# Patient Record
Sex: Female | Born: 2000 | Race: Black or African American | Hispanic: No | Marital: Single | Smoking: Never smoker
Health system: Southern US, Community
[De-identification: ages and names within clinical notes are randomized; demographics above are authoritative.]

---
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
## 2021-04-17 ENCOUNTER — Other Ambulatory Visit: Payer: Self-pay

## 2021-04-17 ENCOUNTER — Ambulatory Visit (HOSPITAL_COMMUNITY)
Admission: EM | Admit: 2021-04-17 | Discharge: 2021-04-17 | Disposition: A | Discharge status: Home / Self Care | Payer: BC Managed Care – PPO | Attending: Family Medicine | Admitting: Family Medicine

## 2021-04-17 ENCOUNTER — Encounter (HOSPITAL_COMMUNITY): Payer: Self-pay

## 2021-04-17 DIAGNOSIS — J069 Acute upper respiratory infection, unspecified: Secondary | ICD-10-CM | POA: Diagnosis not present

## 2021-04-17 DIAGNOSIS — Z20822 Contact with and (suspected) exposure to covid-19: Secondary | ICD-10-CM | POA: Insufficient documentation

## 2021-04-17 LAB — SARS CORONAVIRUS 2 (TAT 6-24 HRS): SARS Coronavirus 2: NEGATIVE

## 2021-04-17 NOTE — Discharge Instructions (Addendum)
You have been tested for COVID-19 today. °If your test returns positive, you will receive a phone call from [facility] regarding your results. °Negative test results are not called. °Both positive and negative results area always visible on MyChart. °If you do not have a MyChart account, sign up instructions are provided in your discharge papers. °Please do not hesitate to contact us should you have questions or concerns. ° °

## 2021-04-17 NOTE — ED Provider Notes (Signed)
  [facility]   [id number] 04/17/21 Arrival Time: 1140  ASSESSMENT & PLAN:  1. Viral upper respiratory tract infection   2. Exposure to COVID-19 virus    COVID testing sent. OTC symptom care as needed.    Follow-up Information     [facility].   Specialty: Urgent Care Why: As needed. Contact information: [address] [place] [address] [phone]                Reviewed expectations re: course of current medical issues. Questions answered. Outlined signs and symptoms indicating need for more acute intervention. Understanding verbalized. After Visit Summary given.   SUBJECTIVE: History from: patient. [name] is a 20 y.o. female who presents with worries regarding COVID-19. Known COVID-19 contact: family member. Recent travel: none. Reports: HA, chills, cough; few days. Denies: fever and difficulty breathing. Normal PO intake without n/v/d.    OBJECTIVE:  Vitals:   04/17/21 1252  BP: (!) 92/58  Pulse: 100  Resp: 17  Temp: 99 F (37.2 C)  TempSrc: Oral  SpO2: 98%    General appearance: alert; no distress Eyes: PERRLA; EOMI; conjunctiva normal HENT: [place]; AT; with nasal congestion Neck: supple  Lungs: speaks full sentences without difficulty; unlabored Extremities: no edema Skin: warm and dry Neurologic: normal gait Psychological: alert and cooperative; normal mood and affect  Labs:  Labs Reviewed  SARS CORONAVIRUS 2 (TAT 6-24 HRS)    No Known Allergies  History reviewed. No pertinent past medical history. Social History   Socioeconomic History   Marital status: Single    Spouse name: Not on file   Number of children: Not on file   Years of education: Not on file   Highest education level: Not on file  Occupational History   Not on file  Tobacco Use   Smoking status: Never   Smokeless tobacco: Never  Vaping Use   Vaping Use: Never used  Substance and Sexual Activity    Alcohol use: Never   Drug use: Never   Sexual activity: Not on file  Other Topics Concern   Not on file  Social History Narrative   Not on file   Social Determinants of Health   Financial Resource Strain: Not on file  Food Insecurity: Not on file  Transportation Needs: Not on file  Physical Activity: Not on file  Stress: Not on file  Social Connections: Not on file  Intimate Partner Violence: Not on file   No family history on file. History reviewed. No pertinent surgical history.   Mardella Layman, MD 04/17/21 1341

## 2021-04-17 NOTE — ED Triage Notes (Signed)
Pt c/o chills and headaches X 2 days. Pt denies other sxs.

## 2021-04-22 ENCOUNTER — Other Ambulatory Visit: Payer: Self-pay

## 2021-04-22 ENCOUNTER — Ambulatory Visit (HOSPITAL_COMMUNITY)
Admission: EM | Admit: 2021-04-22 | Discharge: 2021-04-22 | Disposition: A | Discharge status: Home / Self Care | Payer: BC Managed Care – PPO | Attending: Emergency Medicine | Admitting: Emergency Medicine

## 2021-04-22 ENCOUNTER — Encounter (HOSPITAL_COMMUNITY): Payer: Self-pay

## 2021-04-22 DIAGNOSIS — R102 Pelvic and perineal pain: Secondary | ICD-10-CM | POA: Diagnosis not present

## 2021-04-22 DIAGNOSIS — R1030 Lower abdominal pain, unspecified: Secondary | ICD-10-CM

## 2021-04-22 LAB — POCT URINALYSIS DIPSTICK, ED / UC
Bilirubin Urine: NEGATIVE
Glucose, UA: NEGATIVE mg/dL
Hgb urine dipstick: NEGATIVE
Ketones, ur: NEGATIVE mg/dL
Leukocytes,Ua: NEGATIVE
Nitrite: NEGATIVE
Protein, ur: NEGATIVE mg/dL
Specific Gravity, Urine: 1.025 (ref 1.005–1.030)
Urobilinogen, UA: 0.2 mg/dL (ref 0.0–1.0)
pH: 5.5 (ref 5.0–8.0)

## 2021-04-22 MED ORDER — IBUPROFEN 400 MG PO TABS: 400.0000 mg | ORAL_TABLET | Freq: Four times a day (QID) | ORAL | 0 refills | Status: DC | PRN

## 2021-04-22 NOTE — ED Provider Notes (Signed)
HPI  SUBJECTIVE:  [name] is a 20 y.o. female who presents with intermittent, midline suprapubic pain/soreness starting this morning.  It does not migrate or radiate.  Her last bowel movement was 2 days ago.  No nausea [place], fevers, abdominal distention, urinary complaints.  No vaginal odor, bleeding, rash, discharge, vaginal itching.  She states that she has never been sexually active.  No flank, back pain, anorexia.  No antipyretic in the past 6 hours.  She has never had symptoms like this before.  She has not tried anything for this.  No aggravating or alleviating factors.  Past medical history negative for UTI, nephrolithiasis, BV, yeast.  LMP: 5/27.  Denies possibility being pregnant.  PMD: None.    History reviewed. No pertinent past medical history.  History reviewed. No pertinent surgical history.  History reviewed. No pertinent family history.  Social History   Tobacco Use   Smoking status: Never   Smokeless tobacco: Never  Vaping Use   Vaping Use: Never used  Substance Use Topics   Alcohol use: Never   Drug use: Never    No current facility-administered medications for this encounter. No current outpatient medications on file.  No Known Allergies   ROS  As noted in HPI.   Physical Exam  BP 90/61   Pulse 78   Temp 98.6 F (37 C) (Oral)   Resp 16   LMP 04/04/2021 (Exact Date)   SpO2 100%   Constitutional: Well developed, well nourished, no acute distress Eyes:  EOMI, conjunctiva normal bilaterally HENT: Normocephalic, atraumatic,mucus membranes moist Respiratory: Normal inspiratory effort Cardiovascular: Normal rate GI: nondistended, normal appearance, soft.  Positive mild suprapubic tenderness.  No guarding, rebound.  Negative Murphy, negative McBurney.  No flank tenderness.  Active bowel sounds. Back: No CVAT skin: No rash, skin intact Musculoskeletal: no deformities Neurologic: Alert & oriented x 3, no focal neuro deficits Psychiatric: Speech  and behavior appropriate   ED Course   Medications - No data to display  Orders Placed This Encounter  Procedures   Urine culture    Standing Status:   Standing    Number of Occurrences:   1   POC Urinalysis dipstick    Standing Status:   Standing    Number of Occurrences:   1    Results for orders placed or performed during the hospital encounter of 04/22/21 (from the past 24 hour(s))  POC Urinalysis dipstick     Status: None   Collection Time: 04/22/21  8:48 AM  Result Value Ref Range   Glucose, UA NEGATIVE NEGATIVE mg/dL   Bilirubin Urine NEGATIVE NEGATIVE   Ketones, ur NEGATIVE NEGATIVE mg/dL   Specific Gravity, Urine 1.025 1.005 - 1.030   Hgb urine dipstick NEGATIVE NEGATIVE   pH 5.5 5.0 - 8.0   Protein, ur NEGATIVE NEGATIVE mg/dL   Urobilinogen, UA 0.2 0.0 - 1.0 mg/dL   Nitrite NEGATIVE NEGATIVE   Leukocytes,Ua NEGATIVE NEGATIVE   No results found.  ED Clinical Impression  1. Lower abdominal pain      ED Assessment/Plan  Pt abd exam is benign, no peritoneal signs. No evidence of surgical abd. Doubt SBO, mesenteric ischemia, appendicitis, hepatitis, cholecystitis, pancreatitis, or perforated viscus. No evidence to support or suggest GYN pathology such as ovarian torsion or infection.  She has suprapubic tenderness.  Suspect UTI versus bacterial vaginosis versus constipation.  While her urine dip is negative, will send off for culture to confirm absence of UTI.  Will obtain vaginal swab for  BV.  Will base the treatment on these labs.  Home with Tylenol/ibuprofen.  Will provide primary care list and order assistance in finding a PMD.  ER return precautions given  Discussed labs, imaging, MDM, treatment plan, and plan for follow-up with patient. Discussed sn/sx that should prompt return to the ED. patient agrees with plan.   No orders of the defined types were placed in this encounter.     *This clinic note was created using Dragon dictation software. Therefore,  there may be occasional mistakes despite careful proofreading.  ?    Domenick Gong, MD 04/23/21 1327

## 2021-04-22 NOTE — ED Triage Notes (Signed)
Pt c/o lower abdominal pain since this morning. Pt states she does not have issues with urination or bowel movements. She denies vomiting and nausea.

## 2021-04-22 NOTE — Discharge Instructions (Addendum)
Take 400 mg of ibuprofen combined with 1000 mg of Tylenol together 3 or 4 times a day as needed for pain.  I am sending your urine off for culture and we will check for bacterial vaginosis which can also cause abdominal pain.  Follow-up with a primary care provider of your choice, see list below.  To the ER for the signs and symptoms we discussed  Below is a list of primary care practices who are taking new patients for you to follow-up with.  [facility] internal medicine clinic Ground Floor - [facility], [address] [place], [place], [place] [address] [phone]  [facility] [address] [place], [place] [address] [phone]  [facility] [address] [place], [place] [address] [phone]  [facility] [phone] [address] [place] [address]  [facility]: [address] [place] [address]  [phone]  [facility]: [address] [place] [address]  [phone]  [place] : [address] [place] [address] [phone]  [facility]: [address] [place] [address] [phone]  [facility]: [address] [place] [address] [phone]  Dr. Oneal Grout [address] [facility] [place] [address]  [phone]  Go to www.goodrx.com  or www.costplusdrugs.com to look up your medications. This will give you a list of where you can find your prescriptions at the most affordable prices. Or ask the pharmacist what the cash price is, or if they have any other discount programs available to help make your medication more affordable. This can be less expensive than what you would pay with insurance.

## 2021-04-23 LAB — CERVICOVAGINAL ANCILLARY ONLY
Bacterial Vaginitis (gardnerella): POSITIVE — AB
Comment: NEGATIVE

## 2021-04-23 LAB — URINE CULTURE: Culture: <10000 — AB

## 2021-04-24 ENCOUNTER — Telehealth (HOSPITAL_COMMUNITY): Payer: Self-pay | Admitting: Emergency Medicine

## 2021-04-24 ENCOUNTER — Telehealth (HOSPITAL_BASED_OUTPATIENT_CLINIC_OR_DEPARTMENT_OTHER): Payer: Self-pay

## 2021-04-24 MED ORDER — METRONIDAZOLE 500 MG PO TABS: 500.0000 mg | ORAL_TABLET | Freq: Two times a day (BID) | ORAL | 0 refills | Status: DC

## 2021-04-24 NOTE — Telephone Encounter (Signed)
-----   Message from Aaron Edelman, RN sent at 04/24/2021  8:42 AM EDT ----- Regarding: UC to PCP Patient needs to establish with PCP - routine

## 2023-02-12 ENCOUNTER — Ambulatory Visit (HOSPITAL_COMMUNITY)
Admission: EM | Admit: 2023-02-12 | Discharge: 2023-02-12 | Disposition: A | Discharge status: Home / Self Care | Payer: No Typology Code available for payment source | Attending: Emergency Medicine | Admitting: Emergency Medicine

## 2023-02-12 ENCOUNTER — Encounter (HOSPITAL_COMMUNITY): Payer: Self-pay

## 2023-02-12 DIAGNOSIS — J069 Acute upper respiratory infection, unspecified: Secondary | ICD-10-CM

## 2023-02-12 MED ORDER — FLUTICASONE PROPIONATE 50 MCG/ACT NA SUSP: 2.0000 | Freq: Every day | NASAL | 2 refills | Status: DC

## 2023-02-12 MED ORDER — DEXTROMETHORPHAN POLISTIREX ER 30 MG/5ML PO SUER: 60.0000 mg | Freq: Two times a day (BID) | ORAL | 0 refills | Status: DC | PRN

## 2023-02-12 MED ORDER — CETIRIZINE HCL 1 MG/ML PO SOLN: 10.0000 mg | Freq: Every day | ORAL | 2 refills | Status: DC

## 2023-02-12 NOTE — ED Triage Notes (Addendum)
Pt is here for headache, runny , nasal congestion, cough, watery eyes  lower back pain , throat was scratchy when symptoms started .x 5days . Pt has taken benadryl yesterday and nothing today.

## 2023-02-12 NOTE — ED Provider Notes (Signed)
[facility]    CSN: [id number] Arrival date & time: 02/12/23  1817     History   Chief Complaint Chief Complaint  Patient presents with   Cough   Nasal Congestion    HPI Jalan Hilder is a 22 y.o. female.  Here with 4-5 day history of runny nose, congestion, dry cough Some low back pain  Denies fever, chills, abdominal pain, NVD, rash Has taken benadryl once but no other meds  Was at a convention, several sick contacts  LMP ending today  History reviewed. No pertinent past medical history.  There are no problems to display for this patient.   History reviewed. No pertinent surgical history.  OB History   No obstetric history on file.      Home Medications    Prior to Admission medications   Medication Sig Start Date End Date Taking? Authorizing Provider  cetirizine HCl (ZYRTEC) 1 MG/ML solution Take 10 mLs (10 mg total) by mouth daily. 02/12/23  Yes Ferman Basilio, Lurena Joiner, PA-C  dextromethorphan (DELSYM) 30 MG/5ML liquid Take 10 mLs (60 mg total) by mouth 2 (two) times daily as needed for cough. 02/12/23  Yes Saksham Akkerman, Lurena Joiner, PA-C  fluticasone (FLONASE) 50 MCG/ACT nasal spray Place 2 sprays into both nostrils daily. 02/12/23  Yes Brenisha Tsui, Ray Church    Family History History reviewed. No pertinent family history.  Social History Social History   Tobacco Use   Smoking status: Never   Smokeless tobacco: Never  Vaping Use   Vaping Use: Never used  Substance Use Topics   Alcohol use: Never   Drug use: Never     Allergies   Patient has no known allergies.   Review of Systems Review of Systems As per HPI  Physical Exam Triage Vital Signs ED Triage Vitals  Enc Vitals Group     BP 02/12/23 1833 114/70     Pulse Rate 02/12/23 1833 (!) 109     Resp 02/12/23 1833 12     Temp 02/12/23 1833 98.5 F (36.9 C)     Temp Source 02/12/23 1833 Oral     SpO2 02/12/23 1833 100 %     Weight --      Height --      Head Circumference --      Peak Flow --       Pain Score 02/12/23 1831 2     Pain Loc --      Pain Edu? --      Excl. in GC? --    No data found.  Updated Vital Signs BP 114/70 (BP Location: Left Arm)   Pulse (!) 109   Temp 98.5 F (36.9 C) (Oral)   Resp 12   SpO2 100%    Physical Exam Vitals and nursing note reviewed.  Constitutional:      General: She is not in acute distress. HENT:     Right Ear: Tympanic membrane and ear canal normal.     Left Ear: Tympanic membrane and ear canal normal.     Nose: Congestion present. No rhinorrhea.     Right Turbinates: Swollen.     Left Turbinates: Swollen.     Mouth/Throat:     Mouth: Mucous membranes are moist.     Pharynx: Oropharynx is clear. No posterior oropharyngeal erythema.  Eyes:     Conjunctiva/sclera: Conjunctivae normal.  Cardiovascular:     Rate and Rhythm: Normal rate and regular rhythm.     Pulses: Normal pulses.  Heart sounds: Normal heart sounds.  Pulmonary:     Effort: Pulmonary effort is normal.     Breath sounds: Normal breath sounds.  Abdominal:     General: There is no distension.     Tenderness: There is no abdominal tenderness. There is no guarding or rebound.  Musculoskeletal:     Cervical back: Normal range of motion.  Lymphadenopathy:     Cervical: No cervical adenopathy.  Skin:    General: Skin is warm and dry.  Neurological:     Mental Status: She is alert and oriented to person, place, and time.     UC Treatments / Results  Labs (all labs ordered are listed, but only abnormal results are displayed) Labs Reviewed - No data to display  EKG  Radiology No results found.  Procedures Procedures   Medications Ordered in UC Medications - No data to display  Initial Impression / Assessment and Plan / UC Course  I have reviewed the triage vital signs and the nursing notes.  Pertinent labs & imaging results that were available during my care of the patient were reviewed by me and considered in my medical decision making (see  chart for details).  Well appearing, afebrile. Clear lungs. Suspect viral etiology Symptomatic care at home; zyrtec, flonase, delsym, increase fluids. Return precautions discussed   Final Clinical Impressions(s) / UC Diagnoses   Final diagnoses:  Viral URI with cough     Discharge Instructions      I recommend starting a once daily allergy medicine such as zyrtec. Continue for several weeks to months. Add once daily nasal spray such as flonase to reduce congestion. I recommend Delsym twice daily as needed for cough. Make sure you are drinking lots of fluids!! Try honey and throat lozenges as well. Prop up at night to reduce cough.  You can use tylenol or ibuprofen as needed for pain/aches/fever    ED Prescriptions     Medication Sig Dispense Auth. Provider   fluticasone (FLONASE) 50 MCG/ACT nasal spray Place 2 sprays into both nostrils daily. 9.9 mL Vernestine Brodhead, PA-C   cetirizine HCl (ZYRTEC) 1 MG/ML solution Take 10 mLs (10 mg total) by mouth daily. 236 mL Othel Hoogendoorn, PA-C   dextromethorphan (DELSYM) 30 MG/5ML liquid Take 10 mLs (60 mg total) by mouth 2 (two) times daily as needed for cough. 89 mL Gowri Suchan, Lurena Joinerebecca, PA-C      PDMP not reviewed this encounter.   Kathrine HaddockRising, [place] Dod[place], PA-C 02/12/23 [id number]

## 2023-02-12 NOTE — Discharge Instructions (Addendum)
I recommend starting a once daily allergy medicine such as zyrtec. Continue for several weeks to months. Add once daily nasal spray such as flonase to reduce congestion. I recommend Delsym twice daily as needed for cough. Make sure you are drinking lots of fluids!! Try honey and throat lozenges as well. Prop up at night to reduce cough.  You can use tylenol or ibuprofen as needed for pain/aches/fever

## 2023-04-16 ENCOUNTER — Ambulatory Visit (HOSPITAL_COMMUNITY)
Admission: EM | Admit: 2023-04-16 | Discharge: 2023-04-16 | Disposition: A | Discharge status: Home / Self Care | Payer: No Typology Code available for payment source | Attending: Internal Medicine | Admitting: Internal Medicine

## 2023-04-16 ENCOUNTER — Encounter (HOSPITAL_COMMUNITY): Payer: Self-pay | Admitting: *Deleted

## 2023-04-16 DIAGNOSIS — R519 Headache, unspecified: Secondary | ICD-10-CM

## 2023-04-16 MED ORDER — IBUPROFEN 600 MG PO TABS: 600.0000 mg | ORAL_TABLET | Freq: Four times a day (QID) | ORAL | 0 refills | Status: DC | PRN

## 2023-04-16 MED ORDER — KETOROLAC TROMETHAMINE 30 MG/ML IJ SOLN
30.0000 mg | Freq: Once | INTRAMUSCULAR | Status: AC
  Administered 2023-04-16: 30 mg via INTRAMUSCULAR

## 2023-04-16 MED ORDER — KETOROLAC TROMETHAMINE 30 MG/ML IJ SOLN
INTRAMUSCULAR | Status: AC
  Filled 2023-04-16: qty 1

## 2023-04-16 NOTE — ED Triage Notes (Addendum)
Pt states that today is day 3 of a headache that started when she popped her jaw. She has been taking tylenol with some relief.

## 2023-04-16 NOTE — ED Provider Notes (Signed)
[facility]    CSN: [id number] Arrival date & time: 04/16/23  1910      History   Chief Complaint Chief Complaint  Patient presents with   Headache    HPI [name] is a 22 y.o. female.   Patient presents to urgent care for evaluation of intermittent headache for the last 3 days ever since feeling her "jaw pop".  Denies pain with jaw movement and eating.  Headache is currently to the left temporoparietal region of the head but sometimes moves to the occipital region of the head and to the front of the head.  Headache is currently mild and dull in nature, however she states she intermittently experiences a pulsating and throbbing sensation to the head.  Pain is currently a 5 out of 10.  Denies photophobia, phonophobia, vision changes, dizziness, rash, neck pain, fever, chills, viral upper respiratory tract infection symptoms, ear pain, and paresthesias.  No one-sided weakness or trouble swallowing.  No history of migraine type headaches.  No recent injuries or trauma to the head.  She has been taking Tylenol for headache intermittently without relief.   Headache   History reviewed. No pertinent past medical history.  There are no problems to display for this patient.   History reviewed. No pertinent surgical history.  OB History   No obstetric history on file.      Home Medications    Prior to Admission medications   Medication Sig Start Date End Date Taking? Authorizing Provider  ibuprofen (ADVIL) 600 MG tablet Take 1 tablet (600 mg total) by mouth every 6 (six) hours as needed. 04/16/23  Yes Carlisle Beers, FNP  cetirizine HCl (ZYRTEC) 1 MG/ML solution Take 10 mLs (10 mg total) by mouth daily. 02/12/23   Rising, Lurena Joiner, PA-C  dextromethorphan (DELSYM) 30 MG/5ML liquid Take 10 mLs (60 mg total) by mouth 2 (two) times daily as needed for cough. 02/12/23   Rising, Rebecca, PA-C  fluticasone (FLONASE) 50 MCG/ACT nasal spray Place 2 sprays into both nostrils  daily. 02/12/23   Rising, Ray Church    Family History History reviewed. No pertinent family history.  Social History Social History   Tobacco Use   Smoking status: Never   Smokeless tobacco: Never  Vaping Use   Vaping Use: Never used  Substance Use Topics   Alcohol use: Never   Drug use: Never     Allergies   Patient has no known allergies.   Review of Systems Review of Systems  Neurological:  Positive for headaches.  Per HPI   Physical Exam Triage Vital Signs ED Triage Vitals  Enc Vitals Group     BP 04/16/23 1943 97/60     Pulse Rate 04/16/23 1943 (!) 105     Resp 04/16/23 1943 16     Temp 04/16/23 1943 97.7 F (36.5 C)     Temp Source 04/16/23 1943 Oral     SpO2 04/16/23 1943 95 %     Weight --      Height --      Head Circumference --      Peak Flow --      Pain Score 04/16/23 1942 5     Pain Loc --      Pain Edu? --      Excl. in GC? --    No data found.  Updated Vital Signs BP 97/60 (BP Location: Left Arm)   Pulse (!) 105   Temp 97.7 F (36.5 C) (Oral)  Resp 16   LMP 04/07/2023 (Exact Date)   SpO2 95%   Visual Acuity Right Eye Distance:   Left Eye Distance:   Bilateral Distance:    Right Eye Near:   Left Eye Near:    Bilateral Near:     Physical Exam Vitals and nursing note reviewed.  Constitutional:      Appearance: She is not ill-appearing or toxic-appearing.  HENT:     Head: Normocephalic and atraumatic.     Right Ear: Hearing, tympanic membrane, ear canal and external ear normal.     Left Ear: Hearing, tympanic membrane, ear canal and external ear normal.     Nose: Nose normal.     Mouth/Throat:     Lips: Pink.     Mouth: Mucous membranes are moist. No injury.     Tongue: No lesions. Tongue does not deviate from midline.     Palate: No mass and lesions.     Pharynx: Oropharynx is clear. Uvula midline. No pharyngeal swelling, oropharyngeal exudate, posterior oropharyngeal erythema or uvula swelling.     Tonsils: No  tonsillar exudate or tonsillar abscesses.  Eyes:     General: Lids are normal. Vision grossly intact. Gaze aligned appropriately.     Extraocular Movements: Extraocular movements intact.     Conjunctiva/sclera: Conjunctivae normal.  Cardiovascular:     Rate and Rhythm: Normal rate and regular rhythm.     Heart sounds: Normal heart sounds, S1 normal and S2 normal.  Pulmonary:     Effort: Pulmonary effort is normal. No respiratory distress.     Breath sounds: Normal breath sounds and air entry.  Musculoskeletal:     Cervical back: Neck supple.  Skin:    General: Skin is warm and dry.     Capillary Refill: Capillary refill takes less than 2 seconds.     Findings: No rash.  Neurological:     General: No focal deficit present.     Mental Status: She is alert and oriented to person, place, and time. Mental status is at baseline.     Cranial Nerves: Cranial nerves 2-12 are intact. No dysarthria or facial asymmetry.     Sensory: Sensation is intact.     Motor: Motor function is intact.     Coordination: Coordination is intact.     Gait: Gait is intact.     Comments: Strength and sensation intact to bilateral upper and lower extremities (5/5). Moves all 4 extremities with normal coordination voluntarily. Non-focal neuro exam.   Psychiatric:        Mood and Affect: Mood normal.        Speech: Speech normal.        Behavior: Behavior normal.        Thought Content: Thought content normal.        Judgment: Judgment normal.      UC Treatments / Results  Labs (all labs ordered are listed, but only abnormal results are displayed) Labs Reviewed - No data to display  EKG   Radiology No results found.  Procedures Procedures (including critical care time)  Medications Ordered in UC Medications  ketorolac (TORADOL) 30 MG/ML injection 30 mg (30 mg Intramuscular Given 04/16/23 2002)    Initial Impression / Assessment and Plan / UC Course  I have reviewed the triage vital signs and the  nursing notes.  Pertinent labs & imaging results that were available during my care of the patient were reviewed by me and considered in my medical decision making (  see chart for details).   1.  Bad headache Presentation is consistent with tension type headache.  Vital signs are hemodynamically stable.  Patient is usually slightly tachycardic at baseline (heart rate currently 105).  Neurologically intact to baseline without focal deficit to exam.  Advised to continue drinking plenty of fluids to stay well-hydrated.  Patient given 30 mg IM ketorolac to reduce inflammation and head pain.  May start using ibuprofen 600 mg every 6 hours as needed for head pain starting tomorrow.  Low suspicion for TMJ syndrome.  No trismus.  She is agreeable plan.   Discussed physical exam and available lab work findings in clinic with patient.  Counseled patient regarding appropriate use of medications and potential side effects for all medications recommended or prescribed today. Discussed red flag signs and symptoms of worsening condition,when to call the PCP office, return to urgent care, and when to seek higher level of care in the emergency department. Patient verbalizes understanding and agreement with plan. All questions answered. Patient discharged in stable condition.    Final Clinical Impressions(s) / UC Diagnoses   Final diagnoses:  Bad headache     Discharge Instructions      You have been evaluated today for headache.  You were given medicines for your headache in the clinic today which included a strong NSAID, so do not  take ibuprofen or other NSAIDS (Aleve, aspirin, naproxen, ibuprofen, goody powder, etc.) for the next 12 hours.  Starting tomorrow, take 600mg  ibuprofen every 6 hours or tylenol 1,000 every 6 hours as needed for pain.  Avoid areas of loud noise/harsh light and remember to drink plenty of water to stay well hydrated.  Please follow up with your primary care provider for further  management of your headaches.  Please seek emergency medical care if you experience worsening or uncontrolled pain, vision changes, recurrent vomiting, difficulty with normal activities, abnormal behavior, difficulty walking, numbness, weakness, or any other concerning symptoms.   I hope you feel better!       ED Prescriptions     Medication Sig Dispense Auth. Provider   ibuprofen (ADVIL) 600 MG tablet Take 1 tablet (600 mg total) by mouth every 6 (six) hours as needed. 30 tablet Carlisle Beers, FNP      PDMP not reviewed this encounter.   Carlisle Beers, [place] 04/16/23 2156

## 2023-04-16 NOTE — Discharge Instructions (Signed)
You have been evaluated today for headache.  You were given medicines for your headache in the clinic today which included a strong NSAID, so do not  take ibuprofen or other NSAIDS (Aleve, aspirin, naproxen, ibuprofen, goody powder, etc.) for the next 12 hours.  Starting tomorrow, take 600mg ibuprofen every 6 hours or tylenol 1,000 every 6 hours as needed for pain.  Avoid areas of loud noise/harsh light and remember to drink plenty of water to stay well hydrated.  Please follow up with your primary care provider for further management of your headaches.  Please seek emergency medical care if you experience worsening or uncontrolled pain, vision changes, recurrent vomiting, difficulty with normal activities, abnormal behavior, difficulty walking, numbness, weakness, or any other concerning symptoms.   I hope you feel better!   

## 2023-05-06 ENCOUNTER — Other Ambulatory Visit: Payer: Self-pay

## 2023-05-06 ENCOUNTER — Observation Stay (HOSPITAL_COMMUNITY): Payer: No Typology Code available for payment source

## 2023-05-06 ENCOUNTER — Inpatient Hospital Stay (HOSPITAL_COMMUNITY)
Admission: EM | Admit: 2023-05-06 | Discharge: 2023-05-09 | DRG: 813 | Disposition: A | Discharge status: Home / Self Care | Payer: No Typology Code available for payment source | Attending: Internal Medicine | Admitting: Internal Medicine

## 2023-05-06 DIAGNOSIS — D649 Anemia, unspecified: Secondary | ICD-10-CM | POA: Diagnosis not present

## 2023-05-06 DIAGNOSIS — D509 Iron deficiency anemia, unspecified: Secondary | ICD-10-CM | POA: Diagnosis present

## 2023-05-06 DIAGNOSIS — M549 Dorsalgia, unspecified: Secondary | ICD-10-CM | POA: Diagnosis present

## 2023-05-06 DIAGNOSIS — R21 Rash and other nonspecific skin eruption: Secondary | ICD-10-CM | POA: Diagnosis present

## 2023-05-06 DIAGNOSIS — R0789 Other chest pain: Secondary | ICD-10-CM | POA: Diagnosis present

## 2023-05-06 DIAGNOSIS — N920 Excessive and frequent menstruation with regular cycle: Secondary | ICD-10-CM | POA: Diagnosis present

## 2023-05-06 DIAGNOSIS — E872 Acidosis, unspecified: Secondary | ICD-10-CM | POA: Diagnosis present

## 2023-05-06 DIAGNOSIS — Z79899 Other long term (current) drug therapy: Secondary | ICD-10-CM

## 2023-05-06 DIAGNOSIS — D696 Thrombocytopenia, unspecified: Principal | ICD-10-CM | POA: Insufficient documentation

## 2023-05-06 DIAGNOSIS — R636 Underweight: Secondary | ICD-10-CM | POA: Diagnosis present

## 2023-05-06 DIAGNOSIS — E876 Hypokalemia: Secondary | ICD-10-CM | POA: Diagnosis present

## 2023-05-06 DIAGNOSIS — Z681 Body mass index (BMI) 19 or less, adult: Secondary | ICD-10-CM

## 2023-05-06 DIAGNOSIS — R739 Hyperglycemia, unspecified: Secondary | ICD-10-CM | POA: Diagnosis present

## 2023-05-06 DIAGNOSIS — R7303 Prediabetes: Secondary | ICD-10-CM | POA: Diagnosis present

## 2023-05-06 DIAGNOSIS — E785 Hyperlipidemia, unspecified: Secondary | ICD-10-CM | POA: Diagnosis present

## 2023-05-06 LAB — CBC
HCT: 13.6 % — ABNORMAL LOW (ref 36.0–46.0)
Hemoglobin: 3.8 g/dL — CL (ref 12.0–15.0)
MCH: 18.1 pg — ABNORMAL LOW (ref 26.0–34.0)
MCHC: 27.9 g/dL — ABNORMAL LOW (ref 30.0–36.0)
MCV: 64.8 fL — ABNORMAL LOW (ref 80.0–100.0)
Platelets: 17 10*3/uL — CL (ref 150–400)
RBC: 2.1 MIL/uL — ABNORMAL LOW (ref 3.87–5.11)
RDW: 33.8 % — ABNORMAL HIGH (ref 11.5–15.5)
WBC: 5 10*3/uL (ref 4.0–10.5)
nRBC: 0.4 % — ABNORMAL HIGH (ref 0.0–0.2)

## 2023-05-06 LAB — DIRECT ANTIGLOBULIN TEST (NOT AT ARMC)
DAT, IgG: NEGATIVE
DAT, complement: NEGATIVE

## 2023-05-06 LAB — COMPREHENSIVE METABOLIC PANEL
ALT: 11 U/L (ref 0–44)
AST: 19 U/L (ref 15–41)
Albumin: 4 g/dL (ref 3.5–5.0)
Alkaline Phosphatase: 44 U/L (ref 38–126)
Anion gap: 8 (ref 5–15)
BUN: 8 mg/dL (ref 6–20)
CO2: 22 mmol/L (ref 22–32)
Calcium: 9.2 mg/dL (ref 8.9–10.3)
Chloride: 106 mmol/L (ref 98–111)
Creatinine, Ser: 0.67 mg/dL (ref 0.44–1.00)
GFR, Estimated: >60 mL/min (ref >60)
Glucose, Bld: 101 mg/dL — ABNORMAL HIGH (ref 70–99)
Potassium: 3.3 mmol/L — ABNORMAL LOW (ref 3.5–5.1)
Sodium: 136 mmol/L (ref 135–145)
Total Bilirubin: 0.4 mg/dL (ref 0.3–1.2)
Total Protein: 6.7 g/dL (ref 6.5–8.1)

## 2023-05-06 LAB — RAPID HIV SCREEN (HIV 1/2 AB+AG)
HIV 1/2 Antibodies: NONREACTIVE
HIV-1 P24 Antigen - HIV24: NONREACTIVE

## 2023-05-06 LAB — DIC (DISSEMINATED INTRAVASCULAR COAGULATION)PANEL
D-Dimer, Quant: 0.37 ug/mL-FEU (ref 0.00–0.50)
Fibrinogen: 229 mg/dL (ref 210–475)
INR: 1.1 (ref 0.8–1.2)
Platelets: 18 10*3/uL — CL (ref 150–400)
Prothrombin Time: 14.7 seconds (ref 11.4–15.2)
aPTT: 29 seconds (ref 24–36)

## 2023-05-06 LAB — IRON AND TIBC
Iron: 18 ug/dL — ABNORMAL LOW (ref 28–170)
Saturation Ratios: 4 % — ABNORMAL LOW (ref 10.4–31.8)
TIBC: 507 ug/dL — ABNORMAL HIGH (ref 250–450)
UIBC: 489 ug/dL

## 2023-05-06 LAB — BPAM RBC: Blood Product Expiration Date: 202407182359

## 2023-05-06 LAB — RETICULOCYTES
Immature Retic Fract: 26.1 % — ABNORMAL HIGH (ref 2.3–15.9)
RBC.: 2.03 MIL/uL — ABNORMAL LOW (ref 3.87–5.11)
Retic Count, Absolute: 11.6 10*3/uL — ABNORMAL LOW (ref 19.0–186.0)
Retic Ct Pct: 0.6 % (ref 0.4–3.1)

## 2023-05-06 LAB — PREPARE RBC (CROSSMATCH)

## 2023-05-06 LAB — FOLATE: Folate: 12.4 ng/mL (ref >5.9)

## 2023-05-06 LAB — FERRITIN: Ferritin: 6 ng/mL — ABNORMAL LOW (ref 11–307)

## 2023-05-06 LAB — HCG, SERUM, QUALITATIVE: Preg, Serum: NEGATIVE

## 2023-05-06 LAB — VITAMIN B12: Vitamin B-12: 390 pg/mL (ref 180–914)

## 2023-05-06 LAB — LACTATE DEHYDROGENASE: LDH: 111 U/L (ref 98–192)

## 2023-05-06 LAB — ABO/RH: ABO/RH(D): A POS

## 2023-05-06 MED ORDER — SODIUM CHLORIDE 0.9% IV SOLUTION: Freq: Once | INTRAVENOUS | Status: DC

## 2023-05-06 MED ORDER — LORATADINE 10 MG PO TABS
10.0000 mg | ORAL_TABLET | Freq: Every day | ORAL | Status: DC
  Administered 2023-05-06 – 2023-05-08 (×3): 10 mg via ORAL
  Filled 2023-05-06 (×3): qty 1

## 2023-05-06 MED ORDER — FLUTICASONE PROPIONATE 50 MCG/ACT NA SUSP
2.0000 | Freq: Every day | NASAL | Status: DC
  Administered 2023-05-09: 2 via NASAL
  Filled 2023-05-06: qty 16

## 2023-05-06 MED ORDER — ACETAMINOPHEN 325 MG PO TABS
650.0000 mg | ORAL_TABLET | Freq: Four times a day (QID) | ORAL | Status: DC | PRN
  Administered 2023-05-07: 650 mg via ORAL
  Filled 2023-05-06: qty 2

## 2023-05-06 NOTE — ED Notes (Signed)
ED TO INPATIENT HANDOFF REPORT  ED Nurse Name and Phone #: Britt Boozer 71  S Name/Age/Gender [name] 21 y.o. female Room/Bed: 026C/026C  Code Status   Code Status: Full Code  Home/SNF/Other Home Patient oriented to: self, place, time, and situation Is this baseline? Yes   Triage Complete: Triage complete  Chief Complaint Symptomatic anemia [D64.9]  Triage Note Patient had new patient appointment at a PCP office today where she had blood drawn. She was called back and told her hgb is 3.8 and platelets are 16. Patient denies complaints.    Allergies No Known Allergies  Level of Care/Admitting Diagnosis ED Disposition     ED Disposition  Admit   Condition  --   Comment  Hospital Area: [facility] [[id number]]  Level of Care: Med-Surg [16]  May place patient in observation at [facility] or [facility] if equivalent level of care is available:: No  Covid Evaluation: Asymptomatic - no recent exposure (last 10 days) testing not required  Diagnosis: Symptomatic anemia [1610960]  Admitting Physician: Emeline General [[id number]]  Attending Physician: [facility] [[id number]]          B Medical/Surgery History No past medical history on file. No past surgical history on file.   A IV Location/Drains/Wounds Patient Lines/Drains/Airways Status     Active Line/Drains/Airways     None            Intake/Output Last 24 hours No intake or output data in the 24 hours ending 05/06/23 1917  Labs/Imaging Results for orders placed or performed during the hospital encounter of 05/06/23 (from the past 48 hour(s))  Comprehensive metabolic panel     Status: Abnormal   Collection Time: 05/06/23  4:49 PM  Result Value Ref Range   Sodium 136 135 - 145 mmol/L   Potassium 3.3 (L) 3.5 - 5.1 mmol/L   Chloride 106 98 - 111 mmol/L   CO2 22 22 - 32 mmol/L   Glucose, Bld 101 (H) 70 - 99 mg/dL    Comment: Glucose reference range applies only to samples taken after  fasting for at least 8 hours.   BUN 8 6 - 20 mg/dL   Creatinine, Ser 9.56 0.44 - 1.00 mg/dL   Calcium 9.2 8.9 - 21.3 mg/dL   Total Protein 6.7 6.5 - 8.1 g/dL   Albumin 4.0 3.5 - 5.0 g/dL   AST 19 15 - 41 U/L   ALT 11 0 - 44 U/L   Alkaline Phosphatase 44 38 - 126 U/L   Total Bilirubin 0.4 0.3 - 1.2 mg/dL   GFR, Estimated >08 >65 mL/min    Comment: (NOTE) Calculated using the CKD-EPI Creatinine Equation (2021)    Anion gap 8 5 - 15    Comment: Performed at [facility], [address], [place], [place] [address]  CBC     Status: Abnormal   Collection Time: 05/06/23  4:49 PM  Result Value Ref Range   WBC 5.0 4.0 - 10.5 K/uL   RBC 2.10 (L) 3.87 - 5.11 MIL/uL   Hemoglobin 3.8 (LL) 12.0 - 15.0 g/dL    Comment: REPEATED TO VERIFY Reticulocyte Hemoglobin testing may be clinically indicated, consider ordering this additional test [id number] THIS CRITICAL RESULT HAS VERIFIED AND BEEN CALLED TO J. Sahana Boyland, RN BY SHAY EKDAHL ON 06 27 2024 AT 1752, AND HAS BEEN READ BACK.     HCT 13.6 (L) 36.0 - 46.0 %   MCV 64.8 (L) 80.0 - 100.0  fL   MCH 18.1 (L) 26.0 - 34.0 pg   MCHC 27.9 (L) 30.0 - 36.0 g/dL   RDW 51.7 (H) 61.6 - 07.3 %   Platelets 17 (LL) 150 - 400 K/uL    Comment: Immature Platelet Fraction may be clinically indicated, consider ordering this additional test [id number] REPEATED TO VERIFY THIS CRITICAL RESULT HAS VERIFIED AND BEEN CALLED TO J. Nyilah Kight, RN BY SHAY EKDAHL ON 06 27 2024 AT 1752, AND HAS BEEN READ BACK.     nRBC 0.4 (H) 0.0 - 0.2 %    Comment: Performed at [facility], [address] [place], [place] [address]  Type and screen [facility]     Status: None   Collection Time: 05/06/23  6:20 PM  Result Value Ref Range   ABO/RH(D) A POS    Antibody Screen NEG    Sample Expiration      05/09/2023,2359 Performed at [facility], [address], [place], [place] [address]   Direct antiglobulin test     Status: None   Collection Time:  05/06/23  6:20 PM  Result Value Ref Range   DAT, complement NEG    DAT, IgG      NEG Performed at [facility], [address], [place], [place] [address]    No results found.  Pending Labs Unresulted Labs (From admission, onward)     Start     Ordered   05/07/23 0500  CBC  Tomorrow morning,   R        05/06/23 1900   05/06/23 1849  ABO/Rh  Once,   STAT        05/06/23 1848   05/06/23 1832  Prepare RBC (crossmatch)  (Blood Administration Adult)  Once,   R       Question Answer Comment  # of Units 4 units   Transfusion Indications Hemoglobin < 7 gm/dL and symptomatic   Number of Units to Keep Ahead NO units ahead   If emergent release call blood bank Not emergent release      05/06/23 1831   05/06/23 1806  Lactate dehydrogenase  Once,   STAT        05/06/23 1805   05/06/23 1806  Rapid HIV screen (HIV 1/2 Ab+Ag)  Once,   URGENT        05/06/23 1805   05/06/23 1800  hCG, serum, qualitative  Once,   R        05/06/23 1800   05/06/23 1755  DIC Panel Once  Once,   STAT        05/06/23 1754   05/06/23 1755  Vitamin B12  (Anemia Panel (PNL))  Once,   URGENT        05/06/23 1754   05/06/23 1755  Folate  (Anemia Panel (PNL))  Once,   AD        05/06/23 1754   05/06/23 1755  Iron and TIBC  (Anemia Panel (PNL))  Once,   URGENT        05/06/23 1754   05/06/23 1755  Ferritin  (Anemia Panel (PNL))  Once,   URGENT        05/06/23 1754   05/06/23 1755  Reticulocytes  (Anemia Panel (PNL))  Once,   URGENT        05/06/23 1754            Vitals/Pain Today's Vitals   05/06/23 1628 05/06/23 1637 05/06/23 1638  BP:  109/73    Pulse: 99    Resp: 16    Temp: 98.5 F (36.9 C)    TempSrc: Oral    SpO2: 100%    Weight:   36.7 kg  Height:   4\' 11"  (1.499 m)  PainSc:  0-No pain     Isolation Precautions No active isolations  Medications Medications  0.9 %  sodium chloride infusion (Manually program via Guardrails IV Fluids) (has no administration in time range)   loratadine (CLARITIN) tablet 10 mg (has no administration in time range)  fluticasone (FLONASE) 50 MCG/ACT nasal spray 2 spray (has no administration in time range)  acetaminophen (TYLENOL) tablet 650 mg (has no administration in time range)    Mobility walks      R Recommendations: See Admitting Provider Note  Report given to:   Additional Notes: PCP sent for hgb 3.8 and plt 16, denies fatigue or bleeding. Repeat here hgb 3.8 and plt 17, A&Ox4, getting USIV and will be receiving transfusion, family at bedside

## 2023-05-06 NOTE — ED Provider Notes (Signed)
[place] EMERGENCY DEPARTMENT AT [facility] Provider Note   CSN: [id number] Arrival date & time: 05/06/23  1624     History  Chief Complaint  Patient presents with   Abnormal Lab    [name] is a 22 y.o. female.   Abnormal Lab    Patient denies any significant medical history.  She had a new appointment with a primary care doctor today where she had labs drawn.  Patient was called back with critical values of hemoglobin being very low at 3.8 and platelets at 16.  Patient denies having any bleeding.  She has not noticed any blood in her stool.  She denies any heavy history menstrual bleeding.  She denies feeling lightheaded.  No syncope.  No abdominal pain.  Home Medications Prior to Admission medications   Medication Sig Start Date End Date Taking? Authorizing Provider  cetirizine HCl (ZYRTEC) 1 MG/ML solution Take 10 mLs (10 mg total) by mouth daily. 02/12/23   Rising, Lurena Joiner, PA-C  dextromethorphan (DELSYM) 30 MG/5ML liquid Take 10 mLs (60 mg total) by mouth 2 (two) times daily as needed for cough. 02/12/23   Rising, Rebecca, PA-C  fluticasone (FLONASE) 50 MCG/ACT nasal spray Place 2 sprays into both nostrils daily. 02/12/23   Rising, Lurena Joiner, PA-C  ibuprofen (ADVIL) 600 MG tablet Take 1 tablet (600 mg total) by mouth every 6 (six) hours as needed. 04/16/23   Carlisle Beers, FNP      Allergies    Patient has no known allergies.    Review of Systems   Review of Systems  Physical Exam Updated Vital Signs BP 109/73 (BP Location: Right Arm)   Pulse 99   Temp 98.5 F (36.9 C) (Oral)   Resp 16   Ht 1.499 m (4\' 11" )   Wt 36.7 kg   LMP 04/07/2023 (Exact Date)   SpO2 100%   BMI 16.36 kg/m  Physical Exam Vitals and nursing note reviewed.  Constitutional:      General: She is not in acute distress.    Appearance: She is well-developed.  HENT:     Head: Normocephalic and atraumatic.     Right Ear: External ear normal.     Left Ear: External ear  normal.  Eyes:     General: No scleral icterus.       Right eye: No discharge.        Left eye: No discharge.     Conjunctiva/sclera: Conjunctivae normal.     Comments: Conjunctiva are pale  Neck:     Trachea: No tracheal deviation.  Cardiovascular:     Rate and Rhythm: Normal rate and regular rhythm.  Pulmonary:     Effort: Pulmonary effort is normal. No respiratory distress.     Breath sounds: Normal breath sounds. No stridor. No wheezing or rales.  Abdominal:     General: Bowel sounds are normal. There is no distension.     Palpations: Abdomen is soft.     Tenderness: There is no abdominal tenderness. There is no guarding or rebound.  Musculoskeletal:        General: No tenderness or deformity.     Cervical back: Neck supple.  Skin:    General: Skin is warm and dry.     Coloration: Skin is pale.     Findings: No rash.     Comments: Nailbeds pale  Neurological:     General: No focal deficit present.     Mental Status: She is alert.  Cranial Nerves: No cranial nerve deficit, dysarthria or facial asymmetry.     Sensory: No sensory deficit.     Motor: No abnormal muscle tone or seizure activity.     Coordination: Coordination normal.  Psychiatric:        Mood and Affect: Mood normal.     ED Results / Procedures / Treatments   Labs (all labs ordered are listed, but only abnormal results are displayed) Labs Reviewed  COMPREHENSIVE METABOLIC PANEL - Abnormal; Notable for the following components:      Result Value   Potassium 3.3 (*)    Glucose, Bld 101 (*)    All other components within normal limits  CBC - Abnormal; Notable for the following components:   RBC 2.10 (*)    Hemoglobin 3.8 (*)    HCT 13.6 (*)    MCV 64.8 (*)    MCH 18.1 (*)    MCHC 27.9 (*)    RDW 33.8 (*)    Platelets 17 (*)    nRBC 0.4 (*)    All other components within normal limits  HCG, SERUM, QUALITATIVE  DIC (DISSEMINATED INTRAVASCULAR COAGULATION)PANEL  VITAMIN B12  FOLATE  IRON AND  TIBC  FERRITIN  RETICULOCYTES  LACTATE DEHYDROGENASE  RAPID HIV SCREEN (HIV 1/2 AB+AG)  POC OCCULT BLOOD, ED  TYPE AND SCREEN  DIRECT ANTIGLOBULIN TEST (NOT AT [facility])  PREPARE RBC (CROSSMATCH)    EKG None  Radiology No results found.  Procedures .Critical Care  Performed by: Linwood Dibbles, MD Authorized by: Linwood Dibbles, MD   Critical care provider statement:    Critical care time (minutes):  40   Critical care was time spent personally by me on the following activities:  Development of treatment plan with patient or surrogate, discussions with consultants, evaluation of patient's response to treatment, examination of patient, ordering and review of laboratory studies, ordering and review of radiographic studies, ordering and performing treatments and interventions, pulse oximetry, re-evaluation of patient's condition and review of old charts     Medications Ordered in ED Medications  0.9 %  sodium chloride infusion (Manually program via Guardrails IV Fluids) (has no administration in time range)    ED Course/ Medical Decision Making/ A&P Clinical Course as of 05/06/23 1848  Thu May 06, 2023  1831 Patient declines rectal exam.  Will notify us if she has to have a bowel movement in order to obtain a stool sample [JK]  1847 Case discussed with Dr Chipper Herb regarding admission [JK]    Clinical Course User Index [JK] Linwood Dibbles, MD                             Medical Decision Making Problems Addressed: Anemia, unspecified type: acute illness or injury that poses a threat to life or bodily functions Thrombocytopenia [facility]): acute illness or injury that poses a threat to life or bodily functions  Amount and/or Complexity of Data Reviewed Labs: ordered.    Details: Significant anemia and thrombocytopenia  Risk Prescription drug management. Decision regarding hospitalization.   Patient presents to the ED for evaluation of anemia and thrombocytopenia.  Patient had outpatient  laboratory test today that showed anemia and thrombocytopenia.  Patient is not having any stomach symptoms of fevers or chills.  She is not having any vaginal bleeding.  Patient denies any melena or hematochezia.  Patient's laboratory tests do confirm severely decreased hemoglobin at 3.8.  She also has platelets decreased at 17.  No obvious signs of hemolysis with a normal bilirubin.  Patient does not have any petechiae or purpura on exam.  No signs of blood loss at this time.  Unclear etiology.  Doubt DIC at this time but will send off a panel.  I have consulted with Dr.Iruku, hematology.  She will consult on patient.  Consult with the medical service for admission and further treatment.  Will prior to arise blood transfusion right now as she does not appear to have any signs of bleeding.  She may end up requiring platelet transfusions as well        Final Clinical Impression(s) / ED Diagnoses Final diagnoses:  Anemia, unspecified type  Thrombocytopenia [facility])    Rx / DC Orders ED Discharge Orders     None         Linwood Dibbles, MD 05/06/23 [place]

## 2023-05-06 NOTE — H&P (Signed)
History and Physical    [name] OZD:[id number] DOB: 12-Oct-2001 DOA: 05/06/2023  PCP: Leilani Able, MD (Confirm with patient/family/NH records and if not entered, this has to be entered at [facility] point of entry) Patient coming from: Home   I have personally briefly reviewed patient's old medical records in [facility]  Chief Complaint: Headache  HPI: [name] is a 22 y.o. female with no significant medical history presented with worsening of headache.  Symptoms started about 3 weeks ago with intermittent left-sided frontal headache, radiating to the back, sometimes worsening with body movement, responded to NSAIDs.  Denies any neck pain fever chills vision changes numbness weakness of limbs.  Went to see PCP yesterday, and PCP did a blood draw and this morning, PCP told the patient that her hemoglobin was 3.8 and sent her to ED.  Patient denies any black tarry stool abdominal pain or blood in the stool.  Denies any shortness of breath or chest pains.  Her menstrual cycle has been regular every 30 days last for 7 days, day 2-day 3 usually to be heavy with 3-4 pads, and other days 1-2 pads.  Mother has fibroid and heavy menstrual period underwent hysterectomy several years ago.  ED Course: None tachycardia none hypotension, repeat hemoglobin 3.8, platelets 17.  Review of Systems: As per HPI otherwise 14 point review of systems negative.    No past medical history on file.  No past surgical history on file.   reports that she has never smoked. She has never used smokeless tobacco. She reports that she does not drink alcohol and does not use drugs.  No Known Allergies  No family history on file.   Prior to Admission medications   Medication Sig Start Date End Date Taking? Authorizing Provider  cetirizine HCl (ZYRTEC) 1 MG/ML solution Take 10 mLs (10 mg total) by mouth daily. 02/12/23   Rising, Lurena Joiner, PA-C  dextromethorphan (DELSYM) 30 MG/5ML liquid Take 10 mLs (60 mg  total) by mouth 2 (two) times daily as needed for cough. 02/12/23   Rising, Rebecca, PA-C  fluticasone (FLONASE) 50 MCG/ACT nasal spray Place 2 sprays into both nostrils daily. 02/12/23   Rising, Lurena Joiner, PA-C  ibuprofen (ADVIL) 600 MG tablet Take 1 tablet (600 mg total) by mouth every 6 (six) hours as needed. 04/16/23   Carlisle Beers, FNP    Physical Exam: Vitals:   05/06/23 1628 05/06/23 1638  BP: 109/73   Pulse: 99   Resp: 16   Temp: 98.5 F (36.9 C)   TempSrc: Oral   SpO2: 100%   Weight:  36.7 kg  Height:  4\' 11"  (1.499 m)    Constitutional: NAD, calm, comfortable Vitals:   05/06/23 1628 05/06/23 1638  BP: 109/73   Pulse: 99   Resp: 16   Temp: 98.5 F (36.9 C)   TempSrc: Oral   SpO2: 100%   Weight:  36.7 kg  Height:  4\' 11"  (1.499 m)   Eyes: PERRL, lids and conjunctivae normal ENMT: Mucous membranes are moist. Posterior pharynx clear of any exudate or lesions.Normal dentition.  Neck: normal, supple, no masses, no thyromegaly Respiratory: clear to auscultation bilaterally, no wheezing, no crackles. Normal respiratory effort. No accessory muscle use.  Cardiovascular: Regular rate and rhythm, no murmurs / rubs / gallops. No extremity edema. 2+ pedal pulses. No carotid bruits.  Abdomen: no tenderness, no masses palpated. No hepatosplenomegaly. Bowel sounds positive.  Musculoskeletal: no clubbing / cyanosis. No joint deformity upper and lower extremities. Good  ROM, no contractures. Normal muscle tone.  Skin: no rashes, lesions, ulcers. No induration Neurologic: CN 2-12 grossly intact. Sensation intact, DTR normal. Strength 5/5 in all 4.  Psychiatric: Normal judgment and insight. Alert and oriented x 3. Normal mood.     Labs on Admission: I have personally reviewed following labs and imaging studies  CBC: Recent Labs  Lab 05/06/23 1649  WBC 5.0  HGB 3.8*  HCT 13.6*  MCV 64.8*  PLT 17*   Basic Metabolic Panel: Recent Labs  Lab 05/06/23 1649  NA 136  K 3.3*   CL 106  CO2 22  GLUCOSE 101*  BUN 8  CREATININE 0.67  CALCIUM 9.2   GFR: Estimated Creatinine Clearance: 65 mL/min (by C-G formula based on SCr of 0.67 mg/dL). Liver Function Tests: Recent Labs  Lab 05/06/23 1649  AST 19  ALT 11  ALKPHOS 44  BILITOT 0.4  PROT 6.7  ALBUMIN 4.0   No results for input(s): "LIPASE", "AMYLASE" in the last 168 hours. No results for input(s): "AMMONIA" in the last 168 hours. Coagulation Profile: No results for input(s): "INR", "PROTIME" in the last 168 hours. Cardiac Enzymes: No results for input(s): "CKTOTAL", "CKMB", "CKMBINDEX", "TROPONINI" in the last 168 hours. BNP (last 3 results) No results for input(s): "PROBNP" in the last 8760 hours. HbA1C: No results for input(s): "HGBA1C" in the last 72 hours. CBG: No results for input(s): "GLUCAP" in the last 168 hours. Lipid Profile: No results for input(s): "CHOL", "HDL", "LDLCALC", "TRIG", "CHOLHDL", "LDLDIRECT" in the last 72 hours. Thyroid Function Tests: No results for input(s): "TSH", "T4TOTAL", "FREET4", "T3FREE", "THYROIDAB" in the last 72 hours. Anemia Panel: No results for input(s): "VITAMINB12", "FOLATE", "FERRITIN", "TIBC", "IRON", "RETICCTPCT" in the last 72 hours. Urine analysis:    Component Value Date/Time   LABSPEC 1.025 04/22/2021 0848   PHURINE 5.5 04/22/2021 0848   GLUCOSEU NEGATIVE 04/22/2021 0848   HGBUR NEGATIVE 04/22/2021 0848   BILIRUBINUR NEGATIVE 04/22/2021 0848   KETONESUR NEGATIVE 04/22/2021 0848   PROTEINUR NEGATIVE 04/22/2021 0848   UROBILINOGEN 0.2 04/22/2021 0848   NITRITE NEGATIVE 04/22/2021 0848   LEUKOCYTESUR NEGATIVE 04/22/2021 0848    Radiological Exams on Admission: No results found.  EKG:   Assessment/Plan Principal Problem:   Symptomatic anemia Active Problems:   Thrombocytopenia (HCC)  (please populate well all problems here in Problem List. (For example, if patient is on BP meds at home and you resume or decide to hold them, it is a  problem that needs to be her. Same for CAD, COPD, HLD and so on)  Symptomatic anemia, microcytic -Clinically suspect iron deficiency anemia secondary to menorrhagia -Iron study pending, -Other differential, agreed with checking HIV, hemolytic anemia panel pending.  Hematology consulted in the ED. -PRBC x 4 -Recheck CBC tomorrow.  Thrombocytopenia -Probably related to anemia, -Agreed with HIV screening  Headache -Probably from symptomatic anemia but given there is also concurrent severe thrombocytopenia less than 20,000, ordered CT head -Avoid NSAIDs, Tylenol for headache  DVT prophylaxis: SCD Code Status: Full code Family Communication: Mother at bedside Disposition Plan: Expect less than 2 midnight hospital stay Consults called: Hematology Admission status: MedSurg observation   Emeline General MD [facility] Pager [phone]  05/06/2023, 7:01 PM

## 2023-05-06 NOTE — Progress Notes (Signed)
RN entered room to admit patient. Per patient, Its okay to discuss medical care with current family members in the room(Michelle Witaker(mom), Jamesetta So [place](grandmother), and Anglea Briscoe(great aunt).

## 2023-05-06 NOTE — ED Triage Notes (Signed)
Patient had new patient appointment at a PCP office today where she had blood drawn. She was called back and told her hgb is 3.8 and platelets are 16. Patient denies complaints.

## 2023-05-07 DIAGNOSIS — Z681 Body mass index (BMI) 19 or less, adult: Secondary | ICD-10-CM | POA: Diagnosis not present

## 2023-05-07 DIAGNOSIS — R0789 Other chest pain: Secondary | ICD-10-CM | POA: Diagnosis present

## 2023-05-07 DIAGNOSIS — D696 Thrombocytopenia, unspecified: Secondary | ICD-10-CM

## 2023-05-07 DIAGNOSIS — Z79899 Other long term (current) drug therapy: Secondary | ICD-10-CM | POA: Diagnosis not present

## 2023-05-07 DIAGNOSIS — E876 Hypokalemia: Secondary | ICD-10-CM | POA: Diagnosis present

## 2023-05-07 DIAGNOSIS — D509 Iron deficiency anemia, unspecified: Secondary | ICD-10-CM | POA: Diagnosis present

## 2023-05-07 DIAGNOSIS — D649 Anemia, unspecified: Secondary | ICD-10-CM

## 2023-05-07 DIAGNOSIS — R7303 Prediabetes: Secondary | ICD-10-CM | POA: Diagnosis present

## 2023-05-07 DIAGNOSIS — R739 Hyperglycemia, unspecified: Secondary | ICD-10-CM | POA: Diagnosis present

## 2023-05-07 DIAGNOSIS — N920 Excessive and frequent menstruation with regular cycle: Secondary | ICD-10-CM | POA: Diagnosis present

## 2023-05-07 DIAGNOSIS — R636 Underweight: Secondary | ICD-10-CM | POA: Diagnosis present

## 2023-05-07 DIAGNOSIS — E785 Hyperlipidemia, unspecified: Secondary | ICD-10-CM | POA: Diagnosis present

## 2023-05-07 DIAGNOSIS — R21 Rash and other nonspecific skin eruption: Secondary | ICD-10-CM | POA: Diagnosis present

## 2023-05-07 DIAGNOSIS — E872 Acidosis, unspecified: Secondary | ICD-10-CM | POA: Diagnosis present

## 2023-05-07 DIAGNOSIS — M549 Dorsalgia, unspecified: Secondary | ICD-10-CM | POA: Diagnosis present

## 2023-05-07 LAB — COMPREHENSIVE METABOLIC PANEL
ALT: 10 U/L (ref 0–44)
AST: 23 U/L (ref 15–41)
Albumin: 3.7 g/dL (ref 3.5–5.0)
Alkaline Phosphatase: 43 U/L (ref 38–126)
Anion gap: 7 (ref 5–15)
BUN: 6 mg/dL (ref 6–20)
CO2: 23 mmol/L (ref 22–32)
Calcium: 9.3 mg/dL (ref 8.9–10.3)
Chloride: 110 mmol/L (ref 98–111)
Creatinine, Ser: 0.65 mg/dL (ref 0.44–1.00)
GFR, Estimated: >60 mL/min (ref >60)
Glucose, Bld: 103 mg/dL — ABNORMAL HIGH (ref 70–99)
Potassium: 3.6 mmol/L (ref 3.5–5.1)
Sodium: 140 mmol/L (ref 135–145)
Total Bilirubin: 0.8 mg/dL (ref 0.3–1.2)
Total Protein: 6.8 g/dL (ref 6.5–8.1)

## 2023-05-07 LAB — BPAM RBC
Blood Product Expiration Date: 202407172359
Blood Product Expiration Date: 202407172359
Unit Type and Rh: 6200

## 2023-05-07 LAB — CBC WITH DIFFERENTIAL/PLATELET
Abs Immature Granulocytes: 0.11 10*3/uL — ABNORMAL HIGH (ref 0.00–0.07)
Basophils Absolute: 0.1 10*3/uL (ref 0.0–0.1)
Basophils Relative: 2 %
Eosinophils Absolute: 0.1 10*3/uL (ref 0.0–0.5)
Eosinophils Relative: 1 %
HCT: 41.7 % (ref 36.0–46.0)
Hemoglobin: 13.8 g/dL (ref 12.0–15.0)
Immature Granulocytes: 2 %
Lymphocytes Relative: 26 %
Lymphs Abs: 1.9 10*3/uL (ref 0.7–4.0)
MCH: 25.8 pg — ABNORMAL LOW (ref 26.0–34.0)
MCHC: 33.1 g/dL (ref 30.0–36.0)
MCV: 77.9 fL — ABNORMAL LOW (ref 80.0–100.0)
Monocytes Absolute: 0.6 10*3/uL (ref 0.1–1.0)
Monocytes Relative: 8 %
Neutro Abs: 4.5 10*3/uL (ref 1.7–7.7)
Neutrophils Relative %: 61 %
Platelets: 23 10*3/uL — CL (ref 150–400)
RBC: 5.35 MIL/uL — ABNORMAL HIGH (ref 3.87–5.11)
RDW: 24.7 % — ABNORMAL HIGH (ref 11.5–15.5)
WBC: 7.3 10*3/uL (ref 4.0–10.5)
nRBC: 0.4 % — ABNORMAL HIGH (ref 0.0–0.2)

## 2023-05-07 LAB — MAGNESIUM: Magnesium: 1.7 mg/dL (ref 1.7–2.4)

## 2023-05-07 LAB — PHOSPHORUS: Phosphorus: 3.9 mg/dL (ref 2.5–4.6)

## 2023-05-07 LAB — PATHOLOGIST SMEAR REVIEW

## 2023-05-07 MED ORDER — ALUM & MAG HYDROXIDE-SIMETH 200-200-20 MG/5ML PO SUSP
15.0000 mL | Freq: Four times a day (QID) | ORAL | Status: DC | PRN
  Administered 2023-05-07: 15 mL via ORAL
  Filled 2023-05-07: qty 30

## 2023-05-07 MED ORDER — DEXAMETHASONE 6 MG PO TABS
40.0000 mg | ORAL_TABLET | Freq: Every day | ORAL | Status: DC
  Administered 2023-05-07 – 2023-05-09 (×3): 40 mg via ORAL
  Filled 2023-05-07 (×3): qty 1

## 2023-05-07 MED ORDER — LIDOCAINE 5 % EX PTCH
1.0000 | MEDICATED_PATCH | CUTANEOUS | Status: DC
  Administered 2023-05-07 – 2023-05-09 (×2): 1 via TRANSDERMAL
  Filled 2023-05-07 (×2): qty 1

## 2023-05-07 MED ORDER — PANTOPRAZOLE SODIUM 40 MG PO TBEC
40.0000 mg | DELAYED_RELEASE_TABLET | Freq: Every day | ORAL | Status: DC
  Administered 2023-05-07 – 2023-05-09 (×3): 40 mg via ORAL
  Filled 2023-05-07 (×3): qty 1

## 2023-05-07 NOTE — Progress Notes (Signed)
Blood administration consent signed by the patient completed. See patient hardcopy in chart.

## 2023-05-07 NOTE — Plan of Care (Signed)
VSS. Patient oriented to room. Call bell at bedside and bed in lowest position. All safety measures in place.  Patient deferred to answer admission questions until her mother comes back to visit. Will continue to monitor.  Problem: Clinical Measurements: Goal: Will remain free from infection Outcome: Progressing   Problem: Nutrition: Goal: Adequate nutrition will be maintained Outcome: Progressing

## 2023-05-07 NOTE — Hospital Course (Signed)
The patient is a 22 year old African-American female with no real past medical history who presented with a worsening of her headache and states that symptoms started about 3 weeks ago and with intermittent left-sided frontal headache that radiated to her back but sometimes was worse with body movement and responded to NSAIDs.  She denies neck pain, fevers, chills vision changes or numbness or weakness of the limbs.  Yesterday she went to establish with a PCP and the PCP did blood work and she was found to have a hemoglobin of 3.8 so she was sent to the ED for further evaluation.  She denies any black tarry stools or abdominal pain or blood in her stools.  She does have heavy menstrual bleeding 2 to 3 days out of her normal cycle.  She denies any shortness of breath and patient mother has history of fibroids and heavy menstrual periods and underwent a hysterectomy several years ago.    In the ED she is not tachycardic or hypotensive and repeat hemoglobin was noted to be 3.8 and so she is typed and screened and she is being transfused 4 units of PRBCs and also will be given IV iron.  Today she is still complaining of back pain and has some chest discomfort which she never she is set up and is unclear if she has a history of GERD.  Her pain is improved and her hemoglobin remained stable.  Hematology wants to pursue a bone marrow biopsy but patient's family refused this and want to speak with the hematologist today.  She overall looks improved and will discharge once she is cleared from a hematology perspective.  Assessment and Plan:  Severe symptomatic Anemia, Microcytic -Clinically suspect iron deficiency anemia secondary to menorrhagia -Iron studies were done and showed an iron level of 18, UIBC of 49, TIBC 507, saturation ratios of 4%, ferritin level 6, folate level 12.4 and vitamin B12 390 -Hemoglobin/hematocrit trend: Recent Labs  Lab 05/06/23 1649 05/07/23 1812 05/08/23 0154 05/08/23 1607  05/09/23 0130  HGB 3.8* 13.8 15.3* 16.1* 14.2  HCT 13.6* 41.7 44.9 49.2* 42.9  MCV 64.8* 77.9* 79.0* 78.8* 80.9  -Other differential, agreed with checking HIV, hemolytic anemia panel and the medical oncology hematologist did not see any evidence of hemolysis.  Hematology consulted in the ED. -Patient was being typed screened and transfused 4 units of PRBCs and will also be getting some IV iron with Ferric Gluconate 250 mg IV x2 -Continue to monitor for signs and symptoms bleeding; no overt bleeding noted and repeat CBC in a.m. -Hematology consulted and appreciate their recommendations and they wanted to pursue a bone marrow biopsy but patient and the family are refusing at this time but and when to speak to the hematologist more about this  Metabolic Acidosis -Patient's CO2 is now 20, anion gap is 10, chloride level is 106 -Continue to monitor and trend and repeat CMP in the a.m.  Back Pain -Ordered a lidocaine patch and aquathermia this is improved  Chest Discomfort, improved -Worse upon movement and sitting and patient thinks it may be some reflux -Order Maalox for the patient -Check EKG showed sinus bradycardia at a rate of 53 with PACs and some mild T wave abnormalities and a QTc of 395 no evidence of ST elevation on my interpretation   Thrombocytopenia -Probably related to severe iron deficiency anemia but further workup is pending and she underwent a DIC panel which is unremarkable.  Patient's smear was unimpressive to the hematologist and she did  not feel that the patient truly had schistocytes. -Currently she has no evidence of hemolysis -Platelet Count Trend:  Recent Labs  Lab 05/06/23 1649 05/06/23 1755 05/07/23 1812 05/08/23 0154 05/08/23 1607 05/09/23 0130  PLT 17* 18* 23* 26* 55* 70*  -Agreed with HIV screening and this was negative -Medical oncology may trial some steroids and recommending bone marrow biopsy on Monday, 05/10/2023.  She was initiated on dexamethasone 40  mg p.o. daily for 4 days -Bone marrow biopsy was ordered by hematology in the Interventional Radiology PA went to go speak to the patient and the grandmother about it today and family did not want to pursue a bone marrow biopsy and the family wants to discuss further with the hematologist about a bone marrow biopsy.  Hyperglycemia -Likely Reactive -Glucose Level Trend: Recent Labs  Lab 05/06/23 1649 05/07/23 1812 05/08/23 0154 05/08/23 1607 05/09/23 0130  GLUCOSE 101* 103* 132* 187* 171*  -Check HbA1c in the AM    Headache -Probably from symptomatic anemia but given there is also concurrent severe thrombocytopenia less than 20,000 -Ordered CT head and it showed "No acute intracranial process."  -Avoid NSAIDs, Tylenol for headache  Hypokalemia -Patient's K+ Level Trend: Recent Labs  Lab 05/06/23 1649 05/07/23 1812 05/08/23 0154 05/08/23 1607 05/09/23 0130  K 3.3* 3.6 4.1 3.9 4.2  -Replete with po Kcl 40 mEQ BID x2 -Continue to Monitor and Replete as Necessary -Repeat CMP in the AM   Underweight -Estimated body mass index is 16.36 kg/m as calculated from the following:   Height as of this encounter: 4\' 11"  (1.499 m).   Weight as of this encounter: 36.7 kg. -The nutritionist was consulted and recommending continuing liberalized regular diet and continue po Ensure Enlive BID and MVI + Minerals

## 2023-05-07 NOTE — Consult Note (Signed)
[facility]  Telephone:(336) [phone] [phone]   MEDICAL ONCOLOGY - INITIAL CONSULTATION    Referral MD  Reason for Referral: anemia and thrombocytopenia.  Chief Complaint  Patient presents with   Abnormal Lab   HPI:   This is a pleasant 22 year old female patient with no significant past medical history recently established with a primary care physician given some skin rash on her abdomen.  She had some routine labs drawn which showed severe anemia and thrombocytopenia hence recommended to go to the emergency room.  Prior to this presentation, she last followed up with a doctor when she was 19.  Besides a skin rash, she absolutely denies any new complaints.  She denies any recent infections, known autoimmune diseases, has been eating well, no known nutritional deficiency, no new medications, antibiotics, over-the-counter supplements.  She denies any shortness of breath, palpitations, dizziness.  She tells me that she did not believe her menstrual cycles were heavy but they were in late thought to be heavy when she explained her to the emergency room physician. She denies any other surgical history.  No known family history of hematological disorders.  She denies any headaches although she had some headaches before Father's Day, these have resolved apparently.  Rest of the pertinent 10 point ROS reviewed and negative   Current Facility-Administered Medications  Medication Dose Route Frequency Provider Last Rate Last Admin   0.9 %  sodium chloride infusion (Manually program via Guardrails IV Fluids)   Intravenous Once Linwood Dibbles, MD       acetaminophen (TYLENOL) tablet 650 mg  650 mg Oral Q6H PRN Mikey College T, MD   650 mg at 05/07/23 1029   alum & mag hydroxide-simeth (MAALOX/MYLANTA) 200-200-20 MG/5ML suspension 15 mL  15 mL Oral Q6H PRN Marguerita Merles Latif, DO   15 mL at 05/07/23 1556   fluticasone (FLONASE) 50 MCG/ACT nasal spray 2 spray  2 spray Each Nare  Daily Mikey College T, MD       lidocaine (LIDODERM) 5 % 1 patch  1 patch Transdermal Q24H Sheikh, Omair [place], DO   1 patch at 05/07/23 1108   loratadine (CLARITIN) tablet 10 mg  10 mg Oral Daily Mikey College T, MD   10 mg at 05/06/23 2233     No Known Allergies:  No family history on file.:   Social History   Socioeconomic History   Marital status: Single    Spouse name: Not on file   Number of children: Not on file   Years of education: Not on file   Highest education level: Not on file  Occupational History   Not on file  Tobacco Use   Smoking status: Never   Smokeless tobacco: Never  Vaping Use   Vaping Use: Never used  Substance and Sexual Activity   Alcohol use: Never   Drug use: Never   Sexual activity: Never  Other Topics Concern   Not on file  Social History Narrative   Not on file   Social Determinants of Health   Financial Resource Strain: Not on file  Food Insecurity: Not on file  Transportation Needs: Not on file  Physical Activity: Not on file  Stress: Not on file  Social Connections: Not on file  Intimate Partner Violence: Not on file    Exam: Patient Vitals for the past 24 hrs:  BP Temp Temp src Pulse Resp SpO2  05/07/23 1545 103/81 98 F (36.7 C) -- (!) 57 18 100 %  05/07/23 1526 101/67 98.2 F (36.8 C) Oral (!) 55 16 100 %  05/07/23 1315 92/69 98 F (36.7 C) -- 62 16 --  05/07/23 1300 101/66 98.2 F (36.8 C) Oral 67 18 --  05/07/23 1145 110/77 98 F (36.7 C) Oral 79 16 100 %  05/07/23 1021 95/79 -- -- 71 -- 100 %  05/07/23 0945 109/75 98.2 F (36.8 C) Oral 69 -- 100 %  05/07/23 0830 103/71 98 F (36.7 C) Oral 65 16 100 %  05/07/23 0815 103/72 98 F (36.7 C) Oral 75 18 100 %  05/07/23 0758 105/67 98.6 F (37 C) Oral 72 16 100 %  05/07/23 0710 102/69 98.4 F (36.9 C) Oral 66 17 100 %  05/07/23 0447 97/72 98.4 F (36.9 C) Oral 76 17 100 %  05/07/23 0432 100/63 98.3 F (36.8 C) Oral 72 18 100 %  05/07/23 0253 96/60 98.3 F (36.8  C) Oral 78 18 100 %  05/07/23 0005 97/60 98.5 F (36.9 C) Oral 94 20 100 %  05/06/23 2343 97/61 98.8 F (37.1 C) Oral 92 20 100 %  05/06/23 2017 101/67 98.2 F (36.8 C) Oral 87 20 100 %    Physical Exam Constitutional:      Appearance: Normal appearance.  Cardiovascular:     Rate and Rhythm: Normal rate and regular rhythm.     Pulses: Normal pulses.     Heart sounds: Normal heart sounds.  Pulmonary:     Effort: Pulmonary effort is normal.     Breath sounds: Normal breath sounds.  Abdominal:     General: Abdomen is flat. Bowel sounds are normal. There is no distension.     Palpations: Abdomen is soft. There is no mass.  Musculoskeletal:        General: No swelling. Normal range of motion.     Cervical back: Normal range of motion and neck supple. No rigidity.  Lymphadenopathy:     Cervical: No cervical adenopathy.  Skin:    Findings: Rash (Ecchymosis noted on the abdomen.) present.  Neurological:     General: No focal deficit present.     Mental Status: She is alert.  Psychiatric:        Mood and Affect: Mood normal.       Lab Results  Component Value Date   WBC 5.0 05/06/2023   HGB 3.8 (LL) 05/06/2023   HCT 13.6 (L) 05/06/2023   PLT 18 (LL) 05/06/2023   GLUCOSE 101 (H) 05/06/2023   ALT 11 05/06/2023   AST 19 05/06/2023   NA 136 05/06/2023   K 3.3 (L) 05/06/2023   CL 106 05/06/2023   CREATININE 0.67 05/06/2023   BUN 8 05/06/2023   CO2 22 05/06/2023    CT HEAD WO CONTRAST ( )  Result Date: 05/06/2023 CLINICAL DATA:  Low hemoglobin and platelets EXAM: CT HEAD WITHOUT CONTRAST TECHNIQUE: Contiguous axial images were obtained from the base of the skull through the vertex without intravenous contrast. RADIATION DOSE REDUCTION: This exam was performed according to the departmental dose-optimization program which includes automated exposure control, adjustment of the mA and/or kV according to patient size and/or use of iterative reconstruction technique.  COMPARISON:  None Available. FINDINGS: Brain: No evidence of acute infarction, hemorrhage, mass, mass effect, or midline shift. No hydrocephalus or extra-axial fluid collection. Vascular: No hyperdense vessel. Skull: Negative for fracture or focal lesion. Sinuses/Orbits: No acute finding. Other: The mastoid air cells are well aerated. IMPRESSION: No acute intracranial process. Electronically Signed  By: Wiliam Ke M.D.   On: 05/06/2023 19:46   DG Chest 2 View  Result Date: 05/06/2023 CLINICAL DATA:  Anemia. EXAM: CHEST - 2 VIEW COMPARISON:  Chest radiograph dated 11/22/2005. FINDINGS: No focal consolidation, pleural effusion, or pneumothorax. The cardiac silhouette is within normal limits. No acute osseous pathology. IMPRESSION: No active cardiopulmonary disease. Electronically Signed   By: Elgie Collard M.D.   On: 05/06/2023 19:42    Pathology: Not applicable  CT HEAD WO CONTRAST ( )  Result Date: 05/06/2023 CLINICAL DATA:  Low hemoglobin and platelets EXAM: CT HEAD WITHOUT CONTRAST TECHNIQUE: Contiguous axial images were obtained from the base of the skull through the vertex without intravenous contrast. RADIATION DOSE REDUCTION: This exam was performed according to the departmental dose-optimization program which includes automated exposure control, adjustment of the mA and/or kV according to patient size and/or use of iterative reconstruction technique. COMPARISON:  None Available. FINDINGS: Brain: No evidence of acute infarction, hemorrhage, mass, mass effect, or midline shift. No hydrocephalus or extra-axial fluid collection. Vascular: No hyperdense vessel. Skull: Negative for fracture or focal lesion. Sinuses/Orbits: No acute finding. Other: The mastoid air cells are well aerated. IMPRESSION: No acute intracranial process. Electronically Signed   By: Wiliam Ke M.D.   On: 05/06/2023 19:46   DG Chest 2 View  Result Date: 05/06/2023 CLINICAL DATA:  Anemia. EXAM: CHEST - 2 VIEW  COMPARISON:  Chest radiograph dated 11/22/2005. FINDINGS: No focal consolidation, pleural effusion, or pneumothorax. The cardiac silhouette is within normal limits. No acute osseous pathology. IMPRESSION: No active cardiopulmonary disease. Electronically Signed   By: Elgie Collard M.D.   On: 05/06/2023 19:42    Assessment and Plan:   This is a pleasant 22 year old female patient with no significant past medical history referred to emergency room from her primary care physician's office given severe anemia and thrombocytopenia.  Patient reports some history of headaches which have spontaneously resolved.  Otherwise she has noticed a skin rash across her abdomen which prompted her to establish with a doctor.  She has not seen a doctor in about 2 years.  She denies any complaints at all.  No shortness of breath, chest pain, palpitations, dizziness, recent infections, known autoimmune diseases or nutritional deficiencies.  She is always petite and has been eating well.  She denies any bleeding in her stool or her urine.  She does report regular menstrual cycles which are heavy on a couple days out of the 5 days. On physical exam, she appears to be very well compensated from the anemia standpoint, no tachycardia, ecchymosis noted on the abdominal wall likely from severe thrombocytopenia.  I have reviewed the labs which did not show any evidence of hemolysis, T. bili normal, reticulocyte count normal, LDH normal.  Peripheral smear with no evidence of impressive microangiopathic hemolytic anemia.  DIC panel without any concern for DIC.  The fragmented red blood cells are not consistent with schistocytes, they do have a central pallor.  I reviewed these findings with her hematopathologist Dr. Luisa Hart. At this time since there is no obvious evidence of etiology for this anemia as well as thrombocytopenia, I will give her a trial of steroids to see if this will help her thrombocytopenia.  I will also plan to proceed  with bone marrow aspiration biopsy on Monday morning, she has to be n.p.o. after midnight.   The length of time of the face-to-face encounter was 75 minutes. More than 50% of time was spent counseling and coordination of care.  Thank you for this referral.

## 2023-05-07 NOTE — Progress Notes (Signed)
Blood finished at 1600. Notified Lab to come up in two hours.

## 2023-05-07 NOTE — Progress Notes (Signed)
PROGRESS NOTE    [name]  ZOX:[id number] DOB: 2001/04/16 DOA: 05/06/2023 PCP: Leilani Able, MD   Brief Narrative:  The patient is a 22 year old African-American female with no real past medical history who presented with a worsening of her headache and states that symptoms started about 3 weeks ago and with intermittent left-sided frontal headache that radiated to her back but sometimes was worse with body movement and responded to NSAIDs.  She denies neck pain, fevers, chills vision changes or numbness or weakness of the limbs.  Yesterday she went to establish with a PCP and the PCP did blood work and she was found to have a hemoglobin of 3.8 so she was sent to the ED for further evaluation.  She denies any black tarry stools or abdominal pain or blood in her stools.  She does have heavy menstrual bleeding 2 to 3 days out of her normal cycle.  She denies any shortness of breath and patient mother has history of fibroids and heavy menstrual periods and underwent a hysterectomy several years ago.  In the ED she is not tachycardic or hypotensive and repeat hemoglobin was noted to be 3.8 and so she is typed and screened and she is being transfused 4 units of PRBCs and also will be given IV iron.  Today she is still complaining of back pain and has some chest discomfort which she never she is set up and is unclear if she has a history of GERD.  Assessment and Plan:  Symptomatic Anemia, Microcytic -Clinically suspect iron deficiency anemia secondary to menorrhagia -Iron studies were done and showed an iron level of 18, UIBC of 49, TIBC 507, saturation ratios of 4%, ferritin level 6, folate level 12.4 and vitamin B12 390 -Hemoglobin/hematocrit trend: Recent Labs  Lab 05/06/23 1649  HGB 3.8*  HCT 13.6*  MCV 64.8*  -Other differential, agreed with checking HIV, hemolytic anemia panel and the medical oncology hematologist did not see any evidence of hemolysis.  Hematology consulted in the  ED. -Patient is being typed screened and transfused 4 units of PRBCs and will also be getting some IV iron -Continue to monitor for signs and symptoms bleeding; no overt bleeding noted and repeat CBC in a.m.  Back Pain -Ordered a lidocaine patch and aquathermia  Chest Discomfort -Worse upon movement and sitting and patient thinks it may be some reflux -Order Maalox for the patient -Check EKG    Thrombocytopenia -Probably related to severe iron deficiency anemia but further workup is pending and she underwent a DIC panel which is unremarkable.  Patient's smear was unimpressive to the hematologist and she did not feel that the patient truly had schistocytes. -Currently she has no evidence of hemolysis -Platelet Count Trend:  Recent Labs  Lab 05/06/23 1649 05/06/23 1755  PLT 17* 18*  -Agreed with HIV screening and this was negative -Medical oncology may trial some steroids and recommending bone marrow biopsy on Monday, 05/10/2023  Hyperglycemia -Likely Reactive -Glucose Level Trend: Recent Labs  Lab 05/06/23 1649  GLUCOSE 101*  -Check HbA1c in the AM    Headache -Probably from symptomatic anemia but given there is also concurrent severe thrombocytopenia less than 20,000 -Ordered CT head and it showed "No acute intracranial process."  -Avoid NSAIDs, Tylenol for headache  Hypokalemia -Patient's K+ Level Trend: Recent Labs  Lab 05/06/23 1649  K 3.3*  -Replete with po Kcl 40 mEQ BID x2 -Continue to Monitor and Replete as Necessary -Repeat CMP in the AM   Underweight -Estimated  body mass index is 16.36 kg/m as calculated from the following:   Height as of this encounter: 4\' 11"  (1.499 m).   Weight as of this encounter: 36.7 kg.   DVT prophylaxis: SCDs Start: 05/06/23 1901    Code Status: Full Code Family Communication: Discussed with Mother at bedside  Disposition Plan:  Level of care: Med-Surg Status is: Inpatient Remains inpatient appropriate because: Getting  blood and will need repeat blood work and further workup per hematology as she is going to likely get a bone marrow biopsy   Consultants:  Hematology  Procedures:  As delineated as above  Antimicrobials:  Anti-infectives (From admission, onward)    None       Subjective: Seen and examined at bedside she states that she was having some back discomfort.  Also have some chest discomfort but was little worse with movement.  Feels okay otherwise.  No nausea or vomiting and no bloody stools noted.  Objective: Vitals:   05/07/23 1300 05/07/23 1315 05/07/23 1526 05/07/23 1545  BP: 101/66 92/69 101/67 103/81  Pulse: 67 62 (!) 55 (!) 57  Resp: 18 16 16 18   Temp: 98.2 F (36.8 C) 98 F (36.7 C) 98.2 F (36.8 C) 98 F (36.7 C)  TempSrc: Oral  Oral   SpO2:   100% 100%  Weight:      Height:        Intake/Output Summary (Last 24 hours) at 05/07/2023 1627 Last data filed at 05/07/2023 1545 Gross per 24 hour  Intake 1450 ml  Output --  Net 1450 ml   Filed Weights   05/06/23 1638  Weight: 36.7 kg   Examination: Physical Exam:  Constitutional: Thin African-American female in no acute distress appears calm Respiratory: Clear to auscultation bilaterally, no wheezing, rales, rhonchi or crackles. Normal respiratory effort and patient is not tachypenic. No accessory muscle use.  Unlabored breathing Cardiovascular: RRR, no murmurs / rubs / gallops. S1 and S2 auscultated. No extremity edema.  Abdomen: Soft, non-tender, non-distended. Bowel sounds positive.  GU: Deferred. Musculoskeletal: No clubbing / cyanosis of digits/nails. No joint deformity upper and lower extremities.  Neurologic: CN 2-12 grossly intact with no focal deficits. Romberg sign and cerebellar reflexes not assessed.  Psychiatric: Normal judgment and insight. Alert and oriented x 3. Normal mood and appropriate affect.   Data Reviewed: I have personally reviewed following labs and imaging studies  CBC: Recent Labs   Lab 05/06/23 1649 05/06/23 1755  WBC 5.0  --   HGB 3.8*  --   HCT 13.6*  --   MCV 64.8*  --   PLT 17* 18*   Basic Metabolic Panel: Recent Labs  Lab 05/06/23 1649  NA 136  K 3.3*  CL 106  CO2 22  GLUCOSE 101*  BUN 8  CREATININE 0.67  CALCIUM 9.2   GFR: Estimated Creatinine Clearance: 65 mL/min (by C-G formula based on SCr of 0.67 mg/dL). Liver Function Tests: Recent Labs  Lab 05/06/23 1649  AST 19  ALT 11  ALKPHOS 44  BILITOT 0.4  PROT 6.7  ALBUMIN 4.0   No results for input(s): "LIPASE", "AMYLASE" in the last 168 hours. No results for input(s): "AMMONIA" in the last 168 hours. Coagulation Profile: Recent Labs  Lab 05/06/23 1755  INR 1.1   Cardiac Enzymes: No results for input(s): "CKTOTAL", "CKMB", "CKMBINDEX", "TROPONINI" in the last 168 hours. BNP (last 3 results) No results for input(s): "PROBNP" in the last 8760 hours. HbA1C: No results for input(s): "HGBA1C" in  the last 72 hours. CBG: No results for input(s): "GLUCAP" in the last 168 hours. Lipid Profile: No results for input(s): "CHOL", "HDL", "LDLCALC", "TRIG", "CHOLHDL", "LDLDIRECT" in the last 72 hours. Thyroid Function Tests: No results for input(s): "TSH", "T4TOTAL", "FREET4", "T3FREE", "THYROIDAB" in the last 72 hours. Anemia Panel: Recent Labs    05/06/23 1649 05/06/23 1654 05/06/23 1755 05/06/23 1806  VITAMINB12 390  --   --   --   FOLATE  --  12.4  --   --   FERRITIN  --   --   --  6*  TIBC  --   --   --  507*  IRON  --   --   --  18*  RETICCTPCT  --   --  0.6  --    Sepsis Labs: No results for input(s): "PROCALCITON", "LATICACIDVEN" in the last 168 hours.  No results found for this or any previous visit (from the past 240 hour(s)).   Radiology Studies: CT HEAD WO CONTRAST ( )  Result Date: 05/06/2023 CLINICAL DATA:  Low hemoglobin and platelets EXAM: CT HEAD WITHOUT CONTRAST TECHNIQUE: Contiguous axial images were obtained from the base of the skull through the vertex  without intravenous contrast. RADIATION DOSE REDUCTION: This exam was performed according to the departmental dose-optimization program which includes automated exposure control, adjustment of the mA and/or kV according to patient size and/or use of iterative reconstruction technique. COMPARISON:  None Available. FINDINGS: Brain: No evidence of acute infarction, hemorrhage, mass, mass effect, or midline shift. No hydrocephalus or extra-axial fluid collection. Vascular: No hyperdense vessel. Skull: Negative for fracture or focal lesion. Sinuses/Orbits: No acute finding. Other: The mastoid air cells are well aerated. IMPRESSION: No acute intracranial process. Electronically Signed   By: Wiliam Ke M.D.   On: 05/06/2023 19:46   DG Chest 2 View  Result Date: 05/06/2023 CLINICAL DATA:  Anemia. EXAM: CHEST - 2 VIEW COMPARISON:  Chest radiograph dated 11/22/2005. FINDINGS: No focal consolidation, pleural effusion, or pneumothorax. The cardiac silhouette is within normal limits. No acute osseous pathology. IMPRESSION: No active cardiopulmonary disease. Electronically Signed   By: Elgie Collard M.D.   On: 05/06/2023 19:42    Scheduled Meds:  sodium chloride   Intravenous Once   fluticasone  2 spray Each Nare Daily   lidocaine  1 patch Transdermal Q24H   loratadine  10 mg Oral Daily   Continuous Infusions:   LOS: 0 days   Marguerita Merles, DO [facility] Available via Epic secure chat 7am-7pm After these hours, please refer to coverage provider listed on [email or web address] 05/07/2023, 4:27 PM

## 2023-05-07 NOTE — Progress Notes (Signed)
VSS. Rounds and  Q2T completed. Patient turned self. Patient in bed resting. No signs of distress at this time. Will continue to monitor. 

## 2023-05-08 DIAGNOSIS — D649 Anemia, unspecified: Secondary | ICD-10-CM | POA: Diagnosis not present

## 2023-05-08 DIAGNOSIS — D696 Thrombocytopenia, unspecified: Secondary | ICD-10-CM | POA: Diagnosis not present

## 2023-05-08 LAB — CBC WITH DIFFERENTIAL/PLATELET
Abs Immature Granulocytes: 0.14 10*3/uL — ABNORMAL HIGH (ref 0.00–0.07)
Abs Immature Granulocytes: 0.07 10*3/uL (ref 0.00–0.07)
Basophils Absolute: 0.1 10*3/uL (ref 0.0–0.1)
Basophils Absolute: 0 10*3/uL (ref 0.0–0.1)
Basophils Relative: 1 %
Basophils Relative: 0 %
Eosinophils Absolute: 0 10*3/uL (ref 0.0–0.5)
Eosinophils Absolute: 0 10*3/uL (ref 0.0–0.5)
Eosinophils Relative: 0 %
Eosinophils Relative: 0 %
HCT: 44.9 % (ref 36.0–46.0)
HCT: 49.2 % — ABNORMAL HIGH (ref 36.0–46.0)
Hemoglobin: 15.3 g/dL — ABNORMAL HIGH (ref 12.0–15.0)
Hemoglobin: 16.1 g/dL — ABNORMAL HIGH (ref 12.0–15.0)
Immature Granulocytes: 1 %
Immature Granulocytes: 1 %
Lymphocytes Relative: 14 %
Lymphocytes Relative: 11 %
Lymphs Abs: 1.4 10*3/uL (ref 0.7–4.0)
Lymphs Abs: 1.2 10*3/uL (ref 0.7–4.0)
MCH: 26.9 pg (ref 26.0–34.0)
MCH: 25.8 pg — ABNORMAL LOW (ref 26.0–34.0)
MCHC: 34.1 g/dL (ref 30.0–36.0)
MCHC: 32.7 g/dL (ref 30.0–36.0)
MCV: 79 fL — ABNORMAL LOW (ref 80.0–100.0)
MCV: 78.8 fL — ABNORMAL LOW (ref 80.0–100.0)
Monocytes Absolute: 0.2 10*3/uL (ref 0.1–1.0)
Monocytes Absolute: 0.2 10*3/uL (ref 0.1–1.0)
Monocytes Relative: 2 %
Monocytes Relative: 1 %
Neutro Abs: 8.4 10*3/uL — ABNORMAL HIGH (ref 1.7–7.7)
Neutro Abs: 9.4 10*3/uL — ABNORMAL HIGH (ref 1.7–7.7)
Neutrophils Relative %: 82 %
Neutrophils Relative %: 87 %
Platelets: 26 10*3/uL — CL (ref 150–400)
Platelets: 55 10*3/uL — ABNORMAL LOW (ref 150–400)
RBC: 5.68 MIL/uL — ABNORMAL HIGH (ref 3.87–5.11)
RBC: 6.24 MIL/uL — ABNORMAL HIGH (ref 3.87–5.11)
RDW: 24.7 % — ABNORMAL HIGH (ref 11.5–15.5)
RDW: 25.9 % — ABNORMAL HIGH (ref 11.5–15.5)
WBC: 10.2 10*3/uL (ref 4.0–10.5)
WBC: 10.9 10*3/uL — ABNORMAL HIGH (ref 4.0–10.5)
nRBC: 0.4 % — ABNORMAL HIGH (ref 0.0–0.2)
nRBC: 0.6 % — ABNORMAL HIGH (ref 0.0–0.2)

## 2023-05-08 LAB — COMPREHENSIVE METABOLIC PANEL
ALT: 15 U/L (ref 0–44)
ALT: 15 U/L (ref 0–44)
AST: 26 U/L (ref 15–41)
AST: 27 U/L (ref 15–41)
Albumin: 4.1 g/dL (ref 3.5–5.0)
Albumin: 4 g/dL (ref 3.5–5.0)
Alkaline Phosphatase: 50 U/L (ref 38–126)
Alkaline Phosphatase: 49 U/L (ref 38–126)
Anion gap: 10 (ref 5–15)
Anion gap: 13 (ref 5–15)
BUN: 6 mg/dL (ref 6–20)
BUN: 10 mg/dL (ref 6–20)
CO2: 20 mmol/L — ABNORMAL LOW (ref 22–32)
CO2: 18 mmol/L — ABNORMAL LOW (ref 22–32)
Calcium: 9.8 mg/dL (ref 8.9–10.3)
Calcium: 9.6 mg/dL (ref 8.9–10.3)
Chloride: 106 mmol/L (ref 98–111)
Chloride: 106 mmol/L (ref 98–111)
Creatinine, Ser: 0.59 mg/dL (ref 0.44–1.00)
Creatinine, Ser: 0.77 mg/dL (ref 0.44–1.00)
GFR, Estimated: >60 mL/min (ref >60)
GFR, Estimated: >60 mL/min (ref >60)
Glucose, Bld: 132 mg/dL — ABNORMAL HIGH (ref 70–99)
Glucose, Bld: 187 mg/dL — ABNORMAL HIGH (ref 70–99)
Potassium: 4.1 mmol/L (ref 3.5–5.1)
Potassium: 3.9 mmol/L (ref 3.5–5.1)
Sodium: 136 mmol/L (ref 135–145)
Sodium: 137 mmol/L (ref 135–145)
Total Bilirubin: 0.7 mg/dL (ref 0.3–1.2)
Total Bilirubin: 0.2 mg/dL — ABNORMAL LOW (ref 0.3–1.2)
Total Protein: 7.5 g/dL (ref 6.5–8.1)
Total Protein: 7.4 g/dL (ref 6.5–8.1)

## 2023-05-08 LAB — BPAM RBC
ISSUE DATE / TIME: 202406272337
ISSUE DATE / TIME: 202406280804
ISSUE DATE / TIME: 202406281300
Unit Type and Rh: 6200
Unit Type and Rh: 6200
Unit Type and Rh: 6200

## 2023-05-08 LAB — TYPE AND SCREEN
ABO/RH(D): A POS
Antibody Screen: NEGATIVE
Unit division: 0
Unit division: 0
Unit division: 0
Unit division: 0

## 2023-05-08 LAB — PHOSPHORUS
Phosphorus: 2.9 mg/dL (ref 2.5–4.6)
Phosphorus: 2.2 mg/dL — ABNORMAL LOW (ref 2.5–4.6)

## 2023-05-08 LAB — MAGNESIUM
Magnesium: 1.8 mg/dL (ref 1.7–2.4)
Magnesium: 2.4 mg/dL (ref 1.7–2.4)

## 2023-05-08 MED ORDER — MAGNESIUM SULFATE 2 GM/50ML IV SOLN
2.0000 g | Freq: Once | INTRAVENOUS | Status: AC
  Administered 2023-05-08: 2 g via INTRAVENOUS
  Filled 2023-05-08: qty 50

## 2023-05-08 MED ORDER — SODIUM CHLORIDE 0.9 % IV SOLN
250.0000 mg | Freq: Every day | INTRAVENOUS | Status: DC
  Administered 2023-05-08: 250 mg via INTRAVENOUS
  Filled 2023-05-08: qty 20

## 2023-05-08 MED ORDER — ENSURE ENLIVE PO LIQD
237.0000 mL | Freq: Two times a day (BID) | ORAL | Status: DC
  Administered 2023-05-08 – 2023-05-09 (×2): 237 mL via ORAL

## 2023-05-08 MED ORDER — ADULT MULTIVITAMIN W/MINERALS CH
1.0000 | ORAL_TABLET | Freq: Every day | ORAL | Status: DC
  Administered 2023-05-09: 1 via ORAL
  Filled 2023-05-08: qty 1

## 2023-05-08 NOTE — Evaluation (Signed)
Occupational Therapy Evaluation Patient Details Name: [name] MRN: [id number] DOB: Jan 12, 2001 Today's Date: 05/08/2023   History of Present Illness 22 y.o. female presents to [facility] hospital on 05/06/2023 with worsening HA, radiating into back, also anemic to 3.8 with PCP and then referred to ED. No PMH on file.   Clinical Impression   Pt admitted for above dx, PTA pt was ind in everything. Pt currently presenting without any c/o pain and reports improved BLE sensation. Pt currently completing bADLs and ambulating independently. Pt presenting at functional baseline and has no further acute skilled OT needs. No follow-up OT recommended     Recommendations for follow up therapy are one component of a multi-disciplinary discharge planning process, led by the attending physician.  Recommendations may be updated based on patient status, additional functional criteria and insurance authorization.   Assistance Recommended at Discharge None  Patient can return home with the following      Functional Status Assessment  Patient has not had a recent decline in their functional status  Equipment Recommendations  None recommended by OT    Recommendations for Other Services       Precautions / Restrictions Precautions Precautions: None Restrictions Weight Bearing Restrictions: No      Mobility Bed Mobility Overal bed mobility: Independent                  Transfers Overall transfer level: Independent                        Balance Overall balance assessment: Independent                                         ADL either performed or assessed with clinical judgement   ADL Overall ADL's : Independent                                       General ADL Comments: Pt independently ambulating in room and completing bADLs. No troubles identified with bending/reaching     Vision         Perception     Praxis      Pertinent  Vitals/Pain Pain Assessment Pain Assessment: No/denies pain     Hand Dominance     Extremity/Trunk Assessment Upper Extremity Assessment Upper Extremity Assessment: Overall WFL for tasks assessed   Lower Extremity Assessment Lower Extremity Assessment: Overall WFL for tasks assessed (Pt reports return of sensation in BLEs) RLE Sensation: decreased light touch LLE Sensation: decreased light touch   Cervical / Trunk Assessment Cervical / Trunk Assessment: Normal   Communication Communication Communication: No difficulties   Cognition Arousal/Alertness: Awake/alert Behavior During Therapy: WFL for tasks assessed/performed Overall Cognitive Status: Within Functional Limits for tasks assessed                                       General Comments  VSS    Exercises     Shoulder Instructions      Home Living Family/patient expects to be discharged to:: Private residence Living Arrangements: Parent Available Help at Discharge: Family;Available PRN/intermittently Type of Home: House Home Access: Stairs to enter Entrance Stairs-Number of Steps: 1 Entrance Stairs-Rails: None Home Layout:  One level     Bathroom Shower/Tub: Chief Strategy Officer: Standard     Home Equipment: None          Prior Functioning/Environment Prior Level of Function : Independent/Modified Independent;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[employer])                        OT Problem List:        OT Treatment/Interventions:      OT Goals(Current goals can be found in the care plan section) Acute Rehab OT Goals Patient Stated Goal: To go home OT Goal Formulation: With patient Time For Goal Achievement: 05/22/23 Potential to Achieve Goals: Good  OT Frequency:      Co-evaluation              AM-PAC OT "6 Clicks" Daily Activity     Outcome Measure Help from another person eating meals?: None Help from another person taking care of personal grooming?: None Help  from another person toileting, which includes using toliet, bedpan, or urinal?: None Help from another person bathing (including washing, rinsing, drying)?: None Help from another person to put on and taking off regular upper body clothing?: None Help from another person to put on and taking off regular lower body clothing?: None 6 Click Score: 24   End of Session Nurse Communication: Mobility status  Activity Tolerance: Patient tolerated treatment well Patient left: in bed;with family/visitor present;with call bell/phone within reach  OT Visit Diagnosis: Unsteadiness on feet (R26.81)                Time: 1610-9604 OT Time Calculation (min): 8 min Charges:  OT General Charges $OT Visit: 1 Visit OT Evaluation $OT Eval Low Complexity: 1 Low  05/08/2023  AB, OTR/L  Acute Rehabilitation Services  Office: [phone]   Tristan Schroeder 05/08/2023, 6:00 PM

## 2023-05-08 NOTE — Progress Notes (Signed)
PROGRESS NOTE    [name]  ZOX:[id number] DOB: 03/23/2001 DOA: 05/06/2023 PCP: Leilani Able, MD   Brief Narrative:  The patient is a 22 year old African-American female with no real past medical history who presented with a worsening of her headache and states that symptoms started about 3 weeks ago and with intermittent left-sided frontal headache that radiated to her back but sometimes was worse with body movement and responded to NSAIDs.  She denies neck pain, fevers, chills vision changes or numbness or weakness of the limbs.  Yesterday she went to establish with a PCP and the PCP did blood work and she was found to have a hemoglobin of 3.8 so she was sent to the ED for further evaluation.  She denies any black tarry stools or abdominal pain or blood in her stools.  She does have heavy menstrual bleeding 2 to 3 days out of her normal cycle.  She denies any shortness of breath and patient mother has history of fibroids and heavy menstrual periods and underwent a hysterectomy several years ago.    In the ED she is not tachycardic or hypotensive and repeat hemoglobin was noted to be 3.8 and so she is typed and screened and she is being transfused 4 units of PRBCs and also will be given IV iron.  Today she is still complaining of back pain and has some chest discomfort which she never she is set up and is unclear if she has a history of GERD.  Her pain is improved and her hemoglobin remained stable.  Hematology wants to pursue a bone marrow biopsy but patient's family refused this and want to speak with the hematologist today.  She overall looks improved and will discharge once she is cleared from a hematology perspective.  Assessment and Plan:  Severe symptomatic Anemia, Microcytic -Clinically suspect iron deficiency anemia secondary to menorrhagia -Iron studies were done and showed an iron level of 18, UIBC of 49, TIBC 507, saturation ratios of 4%, ferritin level 6, folate level 12.4 and  vitamin B12 390 -Hemoglobin/hematocrit trend: Recent Labs  Lab 05/06/23 1649 05/07/23 1812 05/08/23 0154  HGB 3.8* 13.8 15.3*  HCT 13.6* 41.7 44.9  MCV 64.8* 77.9* 79.0*  -Other differential, agreed with checking HIV, hemolytic anemia panel and the medical oncology hematologist did not see any evidence of hemolysis.  Hematology consulted in the ED. -Patient was being typed screened and transfused 4 units of PRBCs and will also be getting some IV iron with Ferric Gluconate 250 mg IV x2 -Continue to monitor for signs and symptoms bleeding; no overt bleeding noted and repeat CBC in a.m. -Hematology consulted and appreciate their recommendations and they wanted to pursue a bone marrow biopsy but patient and the family are refusing at this time but and when to speak to the hematologist more about this  Metabolic Acidosis -Patient's CO2 is now 20, anion gap is 10, chloride level is 106 -Continue to monitor and trend and repeat CMP in the a.m.  Back Pain -Ordered a lidocaine patch and aquathermia this is improved  Chest Discomfort, improved -Worse upon movement and sitting and patient thinks it may be some reflux -Order Maalox for the patient -Check EKG showed sinus bradycardia at a rate of 53 with PACs and some mild T wave abnormalities and a QTc of 395 no evidence of ST elevation on my interpretation   Thrombocytopenia -Probably related to severe iron deficiency anemia but further workup is pending and she underwent a DIC panel which is  unremarkable.  Patient's smear was unimpressive to the hematologist and she did not feel that the patient truly had schistocytes. -Currently she has no evidence of hemolysis -Platelet Count Trend:  Recent Labs  Lab 05/06/23 1649 05/06/23 1755 05/07/23 1812 05/08/23 0154  PLT 17* 18* 23* 26*  -Agreed with HIV screening and this was negative -Medical oncology may trial some steroids and recommending bone marrow biopsy on Monday, 05/10/2023.  She was  initiated on dexamethasone 40 mg p.o. daily for 4 days -Bone marrow biopsy was ordered by hematology in the Interventional Radiology PA went to go speak to the patient and the grandmother about it today and family did not want to pursue a bone marrow biopsy and the family wants to discuss further with the hematologist about a bone marrow biopsy.  Hyperglycemia -Likely Reactive -Glucose Level Trend: Recent Labs  Lab 05/06/23 1649 05/07/23 1812 05/08/23 0154  GLUCOSE 101* 103* 132*  -Check HbA1c in the AM    Headache -Probably from symptomatic anemia but given there is also concurrent severe thrombocytopenia less than 20,000 -Ordered CT head and it showed "No acute intracranial process."  -Avoid NSAIDs, Tylenol for headache  Hypokalemia -Patient's K+ Level Trend: Recent Labs  Lab 05/06/23 1649 05/07/23 1812 05/08/23 0154  K 3.3* 3.6 4.1  -Replete with po Kcl 40 mEQ BID x2 -Continue to Monitor and Replete as Necessary -Repeat CMP in the AM   Underweight -Estimated body mass index is 16.36 kg/m as calculated from the following:   Height as of this encounter: 4\' 11"  (1.499 m).   Weight as of this encounter: 36.7 kg. -The nutritionist was consulted and recommending continuing liberalized regular diet and continue po Ensure Enlive BID and MVI + Minerals    DVT prophylaxis: SCDs Start: 05/06/23 1901    Code Status: Full Code Family Communication: Discussed with grandmother at bedside  Disposition Plan:  Level of care: Med-Surg Status is: Inpatient Remains inpatient appropriate because: Needs Hematology Clearance and improvement in her Platelet Count   Consultants:  Hematology Dr. Al Pimple  IR  Procedures:  As delineated as above  Antimicrobials:  Anti-infectives (From admission, onward)    None       Subjective: Seen and Examined at bedside she is sitting in the chair and feels better and denies more back pain or chest pain.  Denies any nausea or vomiting.  No  other concerns or complaints at this time and family does not want to pursue the bone marrow biopsy and wants to speak with the Hematologist about this.  Objective: Vitals:   05/07/23 1545 05/07/23 2133 05/08/23 0543 05/08/23 0801  BP: 103/81 110/88 106/75 120/74  Pulse: (!) 57 66 (!) 59 (!) 41  Resp: 18 18 18 16   Temp: 98 F (36.7 C) 98.2 F (36.8 C) 97.6 F (36.4 C) 97.8 F (36.6 C)  TempSrc:  Oral Oral Oral  SpO2: 100% 100% 100% 100%  Weight:      Height:        Intake/Output Summary (Last 24 hours) at 05/08/2023 1440 Last data filed at 05/07/2023 1545 Gross per 24 hour  Intake 330 ml  Output --  Net 330 ml   Filed Weights   05/06/23 1638  Weight: 36.7 kg   Examination: Physical Exam:  Constitutional: Thin AAF in NAD appears calm Respiratory: Diminished to auscultation bilaterally, no wheezing, rales, rhonchi or crackles. Normal respiratory effort and patient is not tachypenic. No accessory muscle use. Unlabored breathing  Cardiovascular: RRR, no murmurs /  rubs / gallops. S1 and S2 auscultated. No extremity edema.  Abdomen: Soft, non-tender, non-distended. Bowel sounds positive.  GU: Deferred. Musculoskeletal: No clubbing / cyanosis of digits/nails. No joint deformity upper and lower extremities.   Neurologic: CN 2-12 grossly intact with no focal deficits. Romberg sign and cerebellar reflexes not assessed.  Psychiatric: Normal judgment and insight. Alert and oriented x 3. Normal mood and appropriate affect.   Data Reviewed: I have personally reviewed following labs and imaging studies  CBC: Recent Labs  Lab 05/06/23 1649 05/06/23 1755 05/07/23 1812 05/08/23 0154  WBC 5.0  --  7.3 10.2  NEUTROABS  --   --  4.5 8.4*  HGB 3.8*  --  13.8 15.3*  HCT 13.6*  --  41.7 44.9  MCV 64.8*  --  77.9* 79.0*  PLT 17* 18* 23* 26*   Basic Metabolic Panel: Recent Labs  Lab 05/06/23 1649 05/07/23 1812 05/08/23 0154  NA 136 140 136  K 3.3* 3.6 4.1  CL 106 110 106  CO2  22 23 20*  GLUCOSE 101* 103* 132*  BUN 8 6 6   CREATININE 0.67 0.65 0.59  CALCIUM 9.2 9.3 9.8  MG  --  1.7 1.8  PHOS  --  3.9 2.9   GFR: Estimated Creatinine Clearance: 65 mL/min (by C-G formula based on SCr of 0.59 mg/dL). Liver Function Tests: Recent Labs  Lab 05/06/23 1649 05/07/23 1812 05/08/23 0154  AST 19 23 26   ALT 11 10 15   ALKPHOS 44 43 50  BILITOT 0.4 0.8 0.7  PROT 6.7 6.8 7.5  ALBUMIN 4.0 3.7 4.1   No results for input(s): "LIPASE", "AMYLASE" in the last 168 hours. No results for input(s): "AMMONIA" in the last 168 hours. Coagulation Profile: Recent Labs  Lab 05/06/23 1755  INR 1.1   Cardiac Enzymes: No results for input(s): "CKTOTAL", "CKMB", "CKMBINDEX", "TROPONINI" in the last 168 hours. BNP (last 3 results) No results for input(s): "PROBNP" in the last 8760 hours. HbA1C: No results for input(s): "HGBA1C" in the last 72 hours. CBG: No results for input(s): "GLUCAP" in the last 168 hours. Lipid Profile: No results for input(s): "CHOL", "HDL", "LDLCALC", "TRIG", "CHOLHDL", "LDLDIRECT" in the last 72 hours. Thyroid Function Tests: No results for input(s): "TSH", "T4TOTAL", "FREET4", "T3FREE", "THYROIDAB" in the last 72 hours. Anemia Panel: Recent Labs    05/06/23 1649 05/06/23 1654 05/06/23 1755 05/06/23 1806  VITAMINB12 390  --   --   --   FOLATE  --  12.4  --   --   FERRITIN  --   --   --  6*  TIBC  --   --   --  507*  IRON  --   --   --  18*  RETICCTPCT  --   --  0.6  --    Sepsis Labs: No results for input(s): "PROCALCITON", "LATICACIDVEN" in the last 168 hours.  No results found for this or any previous visit (from the past 240 hour(s)).   Radiology Studies: CT HEAD WO CONTRAST ( )  Result Date: 05/06/2023 CLINICAL DATA:  Low hemoglobin and platelets EXAM: CT HEAD WITHOUT CONTRAST TECHNIQUE: Contiguous axial images were obtained from the base of the skull through the vertex without intravenous contrast. RADIATION DOSE REDUCTION: This  exam was performed according to the departmental dose-optimization program which includes automated exposure control, adjustment of the mA and/or kV according to patient size and/or use of iterative reconstruction technique. COMPARISON:  None Available. FINDINGS: Brain: No evidence of acute  infarction, hemorrhage, mass, mass effect, or midline shift. No hydrocephalus or extra-axial fluid collection. Vascular: No hyperdense vessel. Skull: Negative for fracture or focal lesion. Sinuses/Orbits: No acute finding. Other: The mastoid air cells are well aerated. IMPRESSION: No acute intracranial process. Electronically Signed   By: Wiliam Ke M.D.   On: 05/06/2023 19:46   DG Chest 2 View  Result Date: 05/06/2023 CLINICAL DATA:  Anemia. EXAM: CHEST - 2 VIEW COMPARISON:  Chest radiograph dated 11/22/2005. FINDINGS: No focal consolidation, pleural effusion, or pneumothorax. The cardiac silhouette is within normal limits. No acute osseous pathology. IMPRESSION: No active cardiopulmonary disease. Electronically Signed   By: Elgie Collard M.D.   On: 05/06/2023 19:42    Scheduled Meds:  sodium chloride   Intravenous Once   dexamethasone  40 mg Oral Daily   feeding supplement  237 mL Oral BID BM   fluticasone  2 spray Each Nare Daily   lidocaine  1 patch Transdermal Q24H   loratadine  10 mg Oral Daily   [START ON 05/09/2023] multivitamin with minerals  1 tablet Oral Daily   pantoprazole  40 mg Oral Daily   Continuous Infusions:  ferric gluconate (FERRLECIT) IVPB 250 mg (05/08/23 1200)    LOS: 1 day   Marguerita Merles, DO [facility] Available via Epic secure chat 7am-7pm After these hours, please refer to coverage provider listed on [email or web address] 05/08/2023, 2:40 PM

## 2023-05-08 NOTE — Plan of Care (Addendum)
IR was requested for image guided BMBX.   Spoke with the patient and grandmother in the room today.  Grandmother states that patient's mother  currently does not want BMBx to be done, she would like to discuss with doctors why it is needed.   Explained that BMBx is recommended as "blood work" has not showed why patient is having low platelet count, and BMBx is typically recommended as it is most definitive procedure for diagnosis.  Also explained how the procedure is done, risks and benefits in detail with the patient and grandmother.   They states that they are supposed to have meeting with doctors today, IR will follow up tomorrow to see if patient and her family are agreeable to proceed with BMBx.    Attending/hematology notified via secure chat.    Please call IR for questions and concerns.   Lynann Bologna Cynthya Yam PA-C 05/08/2023 12:23 PM

## 2023-05-08 NOTE — Progress Notes (Signed)
Initial Nutrition Assessment  DOCUMENTATION CODES:   Underweight  INTERVENTION:  Continue liberalized regular diet. Encouraged adequate intake at meals.  Provide Ensure Enlive po BID, each supplement provides 350 kcal and 20 grams of protein.  Also discussed other oral nutrition supplements pt can drink at home after discharge if difficult to obtain Ensure in outpatient setting. Discussed store-brand options and Acupuncturist in milk.  Provide multivitamin with minerals po daily.  Monitor magnesium, potassium, and phosphorus daily for at least 3 days, MD to replete as needed, as pt is at risk for refeeding syndrome.  NUTRITION DIAGNOSIS:   Underweight related to  (suspected inadequate oral intake to meet estimated needs) as evidenced by  (BMI 16.36 kg/m2).  GOAL:   Patient will meet greater than or equal to 90% of their needs  MONITOR:   PO intake, Supplement acceptance, Labs, Weight trends, I & O's  REASON FOR ASSESSMENT:   Consult Assessment of nutrition requirement/status  ASSESSMENT:   22 year old female with no significant PMHx admitted with worsening headache found to have symptomatic microcytic anemia with suspected iron deficiency anemia secondary to menorrhagia s/p transfusion of 4 units pRBCs and provision of IV iron, also with thrombocytopenia.  Patient was seen by Hematology. Started on trial of steroids and plan for possible bone marrow aspiration biopsy on Monday morning.   RD working remotely. Spoke with patient over the phone. She reports good appetite and intake that is unchanged from baseline. She reports that this morning for breakfast she had scrambled eggs, sausage, egg mcmuffin, and apple juice and ate well. She reports she has a good appetite at baseline too. Reports typically eating 2 meals per day and snacks between meals. She sleeps until around noon so does not eat breakfast. For lunch she has leftovers from the night before. For  dinner she has pasta with ground [place] or chicken and rice with vegetables (eats with family). For snacks she may have chips, muffins, or cookies. She reports mainly drinking water and will occasionally have soda. Denies food allergies/intolerances. Denies nausea, emesis, or abdominal pain. Denies difficulty with chewing/swallowing. Reports she did not take any vitamin/mineral supplements PTA. Discussed importance of adequate intake at meals and snacks. Pt is agreeable to drinking Ensure supplements to help meet nutrient needs. Pt mother is agreeable to this plan as well. Pt may be at risk for refeeding syndrome in setting of low BMI.  No weight history available in chart to trend. Pt denies any weight changes and reports she has been weight-stable and that she is always small. Admission wt documented as 36.7 kg (81 lbs). She reports this is her UBW. Current BMI is 16.36 kg/m2, which meets criteria for underweight.  Medications reviewed and include: Decadron 40 mg daily po, pantoprazole, ferric gluconate 250 mg daily IV  Labs reviewed: Iron 18 L 05/06/23, TIBC 507 H 05/06/23, Ferritin 6 L 05/06/23, Folate 12.4 05/06/23, Vitamin B12 390 05/06/23, Hgb 3.8 L 6/27, Hct 13.6 L 6/27, MCV 64.8 L 6/27 Potassium, phoshorus, and magnesium have been WNL 6/28 and 6/29  UOP: no documented UOP in previous 24 hours  I/O: +1450 mL since admission  Unable to determine if pt meets criteria for malnutrition at this time without completing NFPE. Pt is at risk for malnutrition.  Sent secure chat message to MD regarding risk for refeeding syndrome.  NUTRITION - FOCUSED PHYSICAL EXAM:  Unable to complete as RD is working remotely  Diet Order:   Diet Order  Diet NPO time specified  Diet effective midnight           Diet regular Room service appropriate? Yes; Fluid consistency: Thin  Diet effective now                  EDUCATION NEEDS:   No education needs have been identified at this  time  Skin:  Skin Assessment: Reviewed RN Assessment (rash to stomach)  Last BM:  Unknown  Height:   Ht Readings from Last 1 Encounters:  05/06/23 4\' 11"  (1.499 m)   Weight:   Wt Readings from Last 1 Encounters:  05/06/23 36.7 kg   Ideal Body Weight:  44.7 kg  BMI:  Body mass index is 16.36 kg/m.  Estimated Nutritional Needs:   Kcal:  1400-1600  Protein:  70-80 grams  Fluid:  1.4-1.6 L/day  Letta Median, MS, RD, LDN, CNSC Pager number available on Amion

## 2023-05-08 NOTE — Evaluation (Signed)
Physical Therapy Evaluation Patient Details Name: [name] MRN: [id number] DOB: 05-04-2001 Today's Date: 05/08/2023  History of Present Illness  22 y.o. female presents to [facility] on 05/06/2023 with worsening HA, radiating into back, also anemic to 3.8 with PCP and then referred to ED. No PMH on file.  Clinical Impression  Pt presents to PT with reports of tingling in both feet, otherwise appears to be at or near her baseline. Pt denies any back pain at this time. Pt ambulates independently and tolerates multiple dynamic balance challenges without significant deviation. Pt has no further acute PT needs at this time. PT signing off.       Recommendations for follow up therapy are one component of a multi-disciplinary discharge planning process, led by the attending physician.  Recommendations may be updated based on patient status, additional functional criteria and insurance authorization.  Follow Up Recommendations       Assistance Recommended at Discharge None  Patient can return home with the following       Equipment Recommendations None recommended by PT  Recommendations for Other Services       Functional Status Assessment Patient has not had a recent decline in their functional status     Precautions / Restrictions Precautions Precautions: None Restrictions Weight Bearing Restrictions: No      Mobility  Bed Mobility Overal bed mobility: Independent                  Transfers Overall transfer level: Independent                      Ambulation/Gait Ambulation/Gait assistance: Independent Gait Distance (Feet): 400 Feet Assistive device: None Gait Pattern/deviations: WFL(Within Functional Limits) Gait velocity: functional Gait velocity interpretation: >2.62 ft/sec, indicative of community ambulatory   General Gait Details: steady step-through gait  Stairs            Wheelchair Mobility    Modified Rankin (Stroke Patients  Only)       Balance Overall balance assessment: Independent (30 seconds rhomber eyes closed, 10 second SLS)                                           Pertinent Vitals/Pain Pain Assessment Pain Assessment: No/denies pain    Home Living Family/patient expects to be discharged to:: Private residence Living Arrangements: Parent Available Help at Discharge: Family;Available PRN/intermittently Type of Home: House Home Access: Stairs to enter Entrance Stairs-Rails: None Entrance Stairs-Number of Steps: 1   Home Layout: One level Home Equipment: None      Prior Function Prior Level of Function : Independent/Modified Independent;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[employer])                     Hand Dominance        Extremity/Trunk Assessment   Upper Extremity Assessment Upper Extremity Assessment: Overall WFL for tasks assessed    Lower Extremity Assessment Lower Extremity Assessment: RLE deficits/detail;LLE deficits/detail (pt reports tingling in feet since administration of iron today) RLE Sensation: decreased light touch LLE Sensation: decreased light touch    Cervical / Trunk Assessment Cervical / Trunk Assessment: Normal  Communication   Communication: No difficulties  Cognition Arousal/Alertness: Awake/alert Behavior During Therapy: WFL for tasks assessed/performed Overall Cognitive Status: Within Functional Limits for tasks assessed  General Comments General comments (skin integrity, edema, etc.): VSS on RA    Exercises     Assessment/Plan    PT Assessment Patient does not need any further PT services  PT Problem List         PT Treatment Interventions      PT Goals (Current goals can be found in the Care Plan section)       Frequency       Co-evaluation               AM-PAC PT "6 Clicks" Mobility  Outcome Measure Help needed turning from your back to your side while in  a flat bed without using bedrails?: None Help needed moving from lying on your back to sitting on the side of a flat bed without using bedrails?: None Help needed moving to and from a bed to a chair (including a wheelchair)?: None Help needed standing up from a chair using your arms (e.g., wheelchair or bedside chair)?: None Help needed to walk in hospital room?: None Help needed climbing 3-5 steps with a railing? : None 6 Click Score: 24    End of Session   Activity Tolerance: Patient tolerated treatment well Patient left: in bed;with call bell/phone within reach;with family/visitor present Nurse Communication: Mobility status PT Visit Diagnosis: Other symptoms and signs involving the nervous system (R29.898)    Time: 9147-8295 PT Time Calculation (min) (ACUTE ONLY): 18 min   Charges:   PT Evaluation $PT Eval Low Complexity: 1 Low          Arlyss Gandy, PT, DPT Acute Rehabilitation Office [phone]   Arlyss Gandy 05/08/2023, 4:33 PM

## 2023-05-09 ENCOUNTER — Encounter (HOSPITAL_COMMUNITY): Payer: Self-pay | Admitting: Internal Medicine

## 2023-05-09 DIAGNOSIS — D649 Anemia, unspecified: Secondary | ICD-10-CM | POA: Diagnosis not present

## 2023-05-09 DIAGNOSIS — D696 Thrombocytopenia, unspecified: Secondary | ICD-10-CM | POA: Diagnosis not present

## 2023-05-09 LAB — MAGNESIUM: Magnesium: 2.1 mg/dL (ref 1.7–2.4)

## 2023-05-09 LAB — CBC WITH DIFFERENTIAL/PLATELET
Abs Immature Granulocytes: 0.15 10*3/uL — ABNORMAL HIGH (ref 0.00–0.07)
Basophils Absolute: 0 10*3/uL (ref 0.0–0.1)
Basophils Relative: 0 %
Eosinophils Absolute: 0 10*3/uL (ref 0.0–0.5)
Eosinophils Relative: 0 %
HCT: 42.9 % (ref 36.0–46.0)
Hemoglobin: 14.2 g/dL (ref 12.0–15.0)
Immature Granulocytes: 1 %
Lymphocytes Relative: 9 %
Lymphs Abs: 1.3 10*3/uL (ref 0.7–4.0)
MCH: 26.8 pg (ref 26.0–34.0)
MCHC: 33.1 g/dL (ref 30.0–36.0)
MCV: 80.9 fL (ref 80.0–100.0)
Monocytes Absolute: 0.9 10*3/uL (ref 0.1–1.0)
Monocytes Relative: 6 %
Neutro Abs: 11.9 10*3/uL — ABNORMAL HIGH (ref 1.7–7.7)
Neutrophils Relative %: 84 %
Platelets: 70 10*3/uL — ABNORMAL LOW (ref 150–400)
RBC: 5.3 MIL/uL — ABNORMAL HIGH (ref 3.87–5.11)
RDW: 25.9 % — ABNORMAL HIGH (ref 11.5–15.5)
Smear Review: NORMAL
WBC: 14.3 10*3/uL — ABNORMAL HIGH (ref 4.0–10.5)
nRBC: 1.4 % — ABNORMAL HIGH (ref 0.0–0.2)

## 2023-05-09 LAB — COMPREHENSIVE METABOLIC PANEL
ALT: 12 U/L (ref 0–44)
AST: 20 U/L (ref 15–41)
Albumin: 3.5 g/dL (ref 3.5–5.0)
Alkaline Phosphatase: 47 U/L (ref 38–126)
Anion gap: 8 (ref 5–15)
BUN: 10 mg/dL (ref 6–20)
CO2: 20 mmol/L — ABNORMAL LOW (ref 22–32)
Calcium: 9.3 mg/dL (ref 8.9–10.3)
Chloride: 111 mmol/L (ref 98–111)
Creatinine, Ser: 0.6 mg/dL (ref 0.44–1.00)
GFR, Estimated: >60 mL/min (ref >60)
Glucose, Bld: 171 mg/dL — ABNORMAL HIGH (ref 70–99)
Potassium: 4.2 mmol/L (ref 3.5–5.1)
Sodium: 139 mmol/L (ref 135–145)
Total Bilirubin: 0.5 mg/dL (ref 0.3–1.2)
Total Protein: 6.6 g/dL (ref 6.5–8.1)

## 2023-05-09 LAB — HAPTOGLOBIN: Haptoglobin: 66 mg/dL (ref 33–278)

## 2023-05-09 LAB — PHOSPHORUS: Phosphorus: 2.4 mg/dL — ABNORMAL LOW (ref 2.5–4.6)

## 2023-05-09 MED ORDER — DEXAMETHASONE 20 MG PO TABS
40.0000 mg | ORAL_TABLET | Freq: Every day | ORAL | 0 refills | Status: DC
Start: 2023-05-10 — End: 2024-01-04

## 2023-05-09 MED ORDER — ACETAMINOPHEN 325 MG PO TABS: 650.0000 mg | ORAL_TABLET | Freq: Four times a day (QID) | ORAL | 0 refills | Status: AC | PRN

## 2023-05-09 MED ORDER — ADULT MULTIVITAMIN W/MINERALS CH: 1.0000 | ORAL_TABLET | Freq: Every day | ORAL | 0 refills | Status: AC

## 2023-05-09 MED ORDER — ENSURE ENLIVE PO LIQD: 237.0000 mL | Freq: Two times a day (BID) | ORAL | 12 refills | Status: AC

## 2023-05-09 MED ORDER — LIDOCAINE 5 % EX PTCH: 1.0000 | MEDICATED_PATCH | Freq: Every day | CUTANEOUS | 0 refills | Status: DC | PRN

## 2023-05-09 MED ORDER — PANTOPRAZOLE SODIUM 40 MG PO TBEC
40.0000 mg | DELAYED_RELEASE_TABLET | Freq: Every day | ORAL | 0 refills | Status: DC
Start: 2023-05-10 — End: 2024-01-04

## 2023-05-09 MED ORDER — K PHOS MONO-SOD PHOS DI & MONO 155-852-130 MG PO TABS
500.0000 mg | ORAL_TABLET | Freq: Once | ORAL | Status: AC
  Administered 2023-05-09: 500 mg via ORAL
  Filled 2023-05-09: qty 2

## 2023-05-09 NOTE — Plan of Care (Signed)
  Problem: Education: Goal: Knowledge of General Education information will improve Description: Including pain rating scale, medication(s)/side effects and non-pharmacologic comfort measures Outcome: Adequate for Discharge   Problem: Health Behavior/Discharge Planning: Goal: Ability to manage health-related needs will improve Outcome: Adequate for Discharge   Problem: Clinical Measurements: Goal: Ability to maintain clinical measurements within normal limits will improve Outcome: Adequate for Discharge Goal: Will remain free from infection Outcome: Adequate for Discharge Goal: Diagnostic test results will improve Outcome: Adequate for Discharge Goal: Respiratory complications will improve Outcome: Adequate for Discharge Goal: Cardiovascular complication will be avoided Outcome: Adequate for Discharge   Problem: Activity: Goal: Risk for activity intolerance will decrease Outcome: Adequate for Discharge   Problem: Nutrition: Goal: Adequate nutrition will be maintained Outcome: Adequate for Discharge   Problem: Coping: Goal: Level of anxiety will decrease Outcome: Adequate for Discharge   Problem: Elimination: Goal: Will not experience complications related to bowel motility Outcome: Adequate for Discharge Goal: Will not experience complications related to urinary retention Outcome: Adequate for Discharge   Problem: Pain Managment: Goal: General experience of comfort will improve Outcome: Adequate for Discharge   Problem: Safety: Goal: Ability to remain free from injury will improve Outcome: Adequate for Discharge   Problem: Skin Integrity: Goal: Risk for impaired skin integrity will decrease Outcome: Adequate for Discharge   Problem: Underweight ([place]-3.1) Goal: Food and/or nutrient delivery Description: Individualized approach for food/nutrient provision. Outcome: Adequate for Discharge

## 2023-05-09 NOTE — Consult Note (Signed)
Chief Complaint: Patient was seen in consultation today for BMBx Chief Complaint  Patient presents with   Abnormal Lab   at the request of Iruku, Praveena   Referring Physician(s): Iruku, Burnice Logan   Supervising Physician: Roanna Banning  Patient Status: [facility] - In-pt  History of Present Illness: [name] is a 22 y.o. female with PMHs of HA, menorrhagia who was admitted after found to have severe anemia with hgb 3.8 and thrombocytopenia 17 on 6/27, she was referred to IR for image guided bone marrow aspiration and biopsy.   Patient has had headache x 3 weeks which she took NSAID, seen by PCP and she was sent to ED after CBC showed severe anemia with hgb 3.8. In ED she was hemodynamically stable, 4 units of PRBCs and IV iron were given. Hematology was consulted and patient was started on steroid, underwent serologic workup which has not shown clear etiology of the anemia and thrombocytopenia, therefore bone marrow aspiration and biopsy was recommended to the patient.   Patient and her family were not sure if they would like to pursue BMBx, but after through discussion with primary, hematology, and myself, patient and her family decided to proceed with the BMBx.  Patient seen in room, she is sitting in bed eating breakfast. Mother at bedside.  All questions answered to their satisfaction, mother is concerned that the procedure would be painful for the patient as she does not tolerate pain well, mother does not want her daughter to suffer during the procedure. Informed mother and the patient that BMBx is very well tolerated procedure with moderate sedation, and sometimes it is done with local anesthetics only per patient request. Informed the patient and mother that IR sedation nursing team will take good care of the patient. Both verbalized understanding.   Patient currently has no complaints such as lightheadedness, SOB, chest palpitation, abd pain, n/v.   History reviewed. No  pertinent past medical history.  History reviewed. No pertinent surgical history.  Allergies: Patient has no known allergies.  Medications: Prior to Admission medications   Medication Sig Start Date End Date Taking? Authorizing Provider  ibuprofen (ADVIL) 600 MG tablet Take 1 tablet (600 mg total) by mouth every 6 (six) hours as needed. Patient taking differently: Take 600 mg by mouth every 6 (six) hours as needed for mild pain. 04/16/23  Yes Carlisle Beers, FNP  cetirizine HCl (ZYRTEC) 1 MG/ML solution Take 10 mLs (10 mg total) by mouth daily. Patient not taking: Reported on 05/06/2023 02/12/23   Rising, Lurena Joiner, PA-C  fluticasone [facility]) 50 MCG/ACT nasal spray Place 2 sprays into both nostrils daily. Patient not taking: Reported on 05/06/2023 02/12/23   Rising, Lurena Joiner, PA-C     History reviewed. No pertinent family history.  Social History   Socioeconomic History   Marital status: Single    Spouse name: Not on file   Number of children: Not on file   Years of education: Not on file   Highest education level: Not on file  Occupational History   Not on file  Tobacco Use   Smoking status: Never   Smokeless tobacco: Never  Vaping Use   Vaping Use: Never used  Substance and Sexual Activity   Alcohol use: Never   Drug use: Never   Sexual activity: Never  Other Topics Concern   Not on file  Social History Narrative   Not on file   Social Determinants of Health   Financial Resource Strain: Not on file  Food  Insecurity: Not on file  Transportation Needs: Not on file  Physical Activity: Not on file  Stress: Not on file  Social Connections: Not on file     Review of Systems: A 12 point ROS discussed and pertinent positives are indicated in the HPI above.  All other systems are negative.  Vital Signs: BP 107/71 (BP Location: Left Arm)   Pulse (!) 47   Temp 97.8 F (36.6 C) (Oral)   Resp 18   Ht 4\' 11"  (1.499 m)   Wt 81 lb (36.7 kg)   LMP 04/07/2023 (Exact  Date)   SpO2 99%   BMI 16.36 kg/m    Physical Exam Vitals reviewed.  Constitutional:      General: She is not in acute distress.    Appearance: She is not ill-appearing.     Comments: Underweight   HENT:     Head: Normocephalic.     Mouth/Throat:     Mouth: Mucous membranes are moist.     Pharynx: Oropharynx is clear.  Eyes:     Extraocular Movements: Extraocular movements intact.  Cardiovascular:     Rate and Rhythm: Normal rate and regular rhythm.     Heart sounds: Normal heart sounds.  Pulmonary:     Effort: Pulmonary effort is normal.     Breath sounds: Normal breath sounds.  Abdominal:     General: Abdomen is flat. Bowel sounds are normal.     Palpations: Abdomen is soft.  Musculoskeletal:     Cervical back: Neck supple.  Skin:    General: Skin is warm and dry.     Coloration: Skin is not jaundiced or pale.  Neurological:     Mental Status: She is alert and oriented to person, place, and time.  Psychiatric:        Mood and Affect: Mood normal.        Behavior: Behavior normal.        Judgment: Judgment normal.     MD Evaluation Airway: WNL Heart: WNL Abdomen: WNL Chest/ Lungs: WNL ASA  Classification: 3 Mallampati/Airway Score: Two  Imaging: CT HEAD WO CONTRAST ( )  Result Date: 05/06/2023 CLINICAL DATA:  Low hemoglobin and platelets EXAM: CT HEAD WITHOUT CONTRAST TECHNIQUE: Contiguous axial images were obtained from the base of the skull through the vertex without intravenous contrast. RADIATION DOSE REDUCTION: This exam was performed according to the departmental dose-optimization program which includes automated exposure control, adjustment of the mA and/or kV according to patient size and/or use of iterative reconstruction technique. COMPARISON:  None Available. FINDINGS: Brain: No evidence of acute infarction, hemorrhage, mass, mass effect, or midline shift. No hydrocephalus or extra-axial fluid collection. Vascular: No hyperdense vessel. Skull:  Negative for fracture or focal lesion. Sinuses/Orbits: No acute finding. Other: The mastoid air cells are well aerated. IMPRESSION: No acute intracranial process. Electronically Signed   By: Wiliam Ke M.D.   On: 05/06/2023 19:46   DG Chest 2 View  Result Date: 05/06/2023 CLINICAL DATA:  Anemia. EXAM: CHEST - 2 VIEW COMPARISON:  Chest radiograph dated 11/22/2005. FINDINGS: No focal consolidation, pleural effusion, or pneumothorax. The cardiac silhouette is within normal limits. No acute osseous pathology. IMPRESSION: No active cardiopulmonary disease. Electronically Signed   By: Elgie Collard M.D.   On: 05/06/2023 19:42    Labs:  CBC: Recent Labs    05/07/23 1812 05/08/23 0154 05/08/23 1607 05/09/23 0130  WBC 7.3 10.2 10.9* 14.3*  HGB 13.8 15.3* 16.1* 14.2  HCT 41.7 44.9 49.2* 42.9  PLT 23* 26* 55* 70*    COAGS: Recent Labs    05/06/23 1755  INR 1.1  APTT 29    BMP: Recent Labs    05/07/23 1812 05/08/23 0154 05/08/23 1607 05/09/23 0130  NA 140 136 137 139  K 3.6 4.1 3.9 4.2  CL 110 106 106 111  CO2 23 20* 18* 20*  GLUCOSE 103* 132* 187* 171*  BUN 6 6 10 10   CALCIUM 9.3 9.8 9.6 9.3  CREATININE 0.65 0.59 0.77 0.60  GFRNONAA >60 >60 >60 >60    LIVER FUNCTION TESTS: Recent Labs    05/07/23 1812 05/08/23 0154 05/08/23 1607 05/09/23 0130  BILITOT 0.8 0.7 0.2* 0.5  AST 23 26 27 20   ALT 10 15 15 12   ALKPHOS 43 50 49 47  PROT 6.8 7.5 7.4 6.6  ALBUMIN 3.7 4.1 4.0 3.5    TUMOR MARKERS: No results for input(s): "AFPTM", "CEA", "CA199", "CHROMGRNA" in the last 8760 hours.  Assessment and Plan: 22 y.o. female with anemia and thrombocytopenia who presents for BMBx.   VSS CBC with diff this morning: leukocytosis 14.3, hgb 14.3, plt 70, neutro abs 11.9  Pt on decadron 40 mg every day   Risks and benefits of BMBx was discussed with the patient and/or patient's family including, but not limited to bleeding, infection, damage to adjacent structures or low  yield requiring additional tests.  All of the questions were answered and there is agreement to proceed.  Consent signed and in chart.  The procedure is tentatively scheduled for Monday pending IR/CT schedule and [facility] cytology availability.   PLAN - NPO at [facility] - CBC with diff in the morning    Thank you for this interesting consult.  I greatly enjoyed meeting [name] and look forward to participating in their care.  A copy of this report was sent to the requesting provider on this date.  Electronically Signed: Willette Brace, PA-C 05/09/2023, 9:56 AM   I spent a total of 40 Minutes    in face to face in clinical consultation, greater than 50% of which was counseling/coordinating care for BMBx.   This chart was dictated using voice recognition software.  Despite best efforts to proofread,  errors can occur which can change the documentation meaning.

## 2023-05-09 NOTE — Discharge Summary (Signed)
Physician Discharge Summary   Patient: [name] MRN: [id number] DOB: 12/18/2000  Admit date:     05/06/2023  Discharge date: 05/09/2023  Discharge Physician: Marguerita Merles, DO   PCP: Leilani Able, MD   Recommendations at discharge:   Follow-up with PCP within 1 to 2 weeks repeat CBC, CMP, mag, Phos within 1 week Follow-up with hematology in outpatient setting Follow-up with OB/GYN in outpatient setting  Discharge Diagnoses: Principal Problem:   Symptomatic anemia Active Problems:   Thrombocytopenia (HCC)  Resolved Problems:   * No resolved hospital problems. [facility] Course: The patient is a 22 year old African-American female with no real past medical history who presented with a worsening of her headache and states that symptoms started about 3 weeks ago and with intermittent left-sided frontal headache that radiated to her back but sometimes was worse with body movement and responded to NSAIDs.  She denies neck pain, fevers, chills vision changes or numbness or weakness of the limbs.  Yesterday she went to establish with a PCP and the PCP did blood work and she was found to have a hemoglobin of 3.8 so she was sent to the ED for further evaluation.  She denies any black tarry stools or abdominal pain or blood in her stools.  She does have heavy menstrual bleeding 2 to 3 days out of her normal cycle.  She denies any shortness of breath and patient mother has history of fibroids and heavy menstrual periods and underwent a hysterectomy several years ago.    In the ED she is not tachycardic or hypotensive and repeat hemoglobin was noted to be 3.8 and so she is typed and screened and she is being transfused 4 units of PRBCs and also will be given IV iron.  Today she is still complaining of back pain and has some chest discomfort which she never she is set up and is unclear if she has a history of GERD.  Her pain is improved and her hemoglobin remained stable.  Hematology wants to  pursue a bone marrow biopsy but patient's family refused this and want to speak with the hematologist today.  She overall looks improved and will discharge once she is cleared from a hematology perspective.  Assessment and Plan:  Severe symptomatic Anemia, Microcytic -Clinically suspect iron deficiency anemia secondary to menorrhagia -Iron studies were done and showed an iron level of 18, UIBC of 49, TIBC 507, saturation ratios of 4%, ferritin level 6, folate level 12.4 and vitamin B12 390 -Hemoglobin/hematocrit trend: Recent Labs  Lab 05/06/23 1649 05/07/23 1812 05/08/23 0154  HGB 3.8* 13.8 15.3*  HCT 13.6* 41.7 44.9  MCV 64.8* 77.9* 79.0*  -Other differential, agreed with checking HIV, hemolytic anemia panel and the medical oncology hematologist did not see any evidence of hemolysis.  Hematology consulted in the ED. -Patient was being typed screened and transfused 4 units of PRBCs and will also be getting some IV iron with Ferric Gluconate 250 mg IV x2 -Continue to monitor for signs and symptoms bleeding; no overt bleeding noted and repeat CBC in a.m. -Hematology consulted and appreciate their recommendations and they wanted to pursue a bone marrow biopsy but patient and the family are refusing at this time but and when to speak to the hematologist more about this  Metabolic Acidosis -Patient's CO2 is now 20, anion gap is 10, chloride level is 106 -Continue to monitor and trend and repeat CMP in the a.m.  Back Pain -Ordered a lidocaine patch and aquathermia this  is improved  Chest Discomfort, improved -Worse upon movement and sitting and patient thinks it may be some reflux -Order Maalox for the patient -Check EKG showed sinus bradycardia at a rate of 53 with PACs and some mild T wave abnormalities and a QTc of 395 no evidence of ST elevation on my interpretation   Thrombocytopenia -Probably related to severe iron deficiency anemia but further workup is pending and she underwent a  DIC panel which is unremarkable.  Patient's smear was unimpressive to the hematologist and she did not feel that the patient truly had schistocytes. -Currently she has no evidence of hemolysis -Platelet Count Trend:  Recent Labs  Lab 05/06/23 1649 05/06/23 1755 05/07/23 1812 05/08/23 0154  PLT 17* 18* 23* 26*  -Agreed with HIV screening and this was negative -Medical oncology may trial some steroids and recommending bone marrow biopsy on Monday, 05/10/2023.  She was initiated on dexamethasone 40 mg p.o. daily for 4 days -Bone marrow biopsy was ordered by hematology in the Interventional Radiology PA went to go speak to the patient and the grandmother about it today and family did not want to pursue a bone marrow biopsy and the family wants to discuss further with the hematologist about a bone marrow biopsy.  Hyperglycemia -Likely Reactive -Glucose Level Trend: Recent Labs  Lab 05/06/23 1649 05/07/23 1812 05/08/23 0154  GLUCOSE 101* 103* 132*  -Check HbA1c in the AM    Headache -Probably from symptomatic anemia but given there is also concurrent severe thrombocytopenia less than 20,000 -Ordered CT head and it showed "No acute intracranial process."  -Avoid NSAIDs, Tylenol for headache  Hypokalemia -Patient's K+ Level Trend: Recent Labs  Lab 05/06/23 1649 05/07/23 1812 05/08/23 0154  K 3.3* 3.6 4.1  -Replete with po Kcl 40 mEQ BID x2 -Continue to Monitor and Replete as Necessary -Repeat CMP in the AM   Underweight -Estimated body mass index is 16.36 kg/m as calculated from the following:   Height as of this encounter: 4\' 11"  (1.499 m).   Weight as of this encounter: 36.7 kg. -The nutritionist was consulted and recommending continuing liberalized regular diet and continue po Ensure Enlive BID and MVI + Minerals   Nutrition Documentation    Flowsheet Row ED to Hosp-Admission (Discharged) from 05/06/2023 in [facility]  SURGICAL  Nutrition Problem  Underweight  Etiology --  [suspected inadequate oral intake to meet estimated needs]  Nutrition Goal Patient will meet greater than or equal to 90% of their needs  Interventions Refer to RD note for recommendations      Consultants: Hematology, interventional radiology Procedures performed: Delineated as above Disposition: Home Diet recommendation:  Discharge Diet Orders (From admission, onward)     Start     Ordered   05/09/23 0000  Diet - low sodium heart healthy        05/09/23 1244           Renal diet DISCHARGE MEDICATION: Allergies as of 05/09/2023   No Known Allergies      Medication List     STOP taking these medications    ibuprofen 600 MG tablet Commonly known as: ADVIL       TAKE these medications    acetaminophen 325 MG tablet Commonly known as: TYLENOL Take 2 tablets (650 mg total) by mouth every 6 (six) hours as needed for headache.   cetirizine HCl 1 MG/ML solution Commonly known as: ZYRTEC Take 10 mLs (10 mg total) by mouth daily.  dexAMETHasone 20 MG Tabs Take 40 mg by mouth daily for 2 days. Start taking on: May 10, 2023   feeding supplement Liqd Take 237 mLs by mouth 2 (two) times daily between meals.   fluticasone 50 MCG/ACT nasal spray Commonly known as: FLONASE Place 2 sprays into both nostrils daily.   lidocaine 5 % Commonly known as: LIDODERM Place 1 patch onto the skin daily as needed (Back Pain). Remove & Discard patch within 12 hours or as directed by MD   multivitamin with minerals Tabs tablet Take 1 tablet by mouth daily. Start taking on: May 10, 2023   pantoprazole 40 MG tablet Commonly known as: PROTONIX Take 1 tablet (40 mg total) by mouth daily for 2 days. Start taking on: May 10, 2023        Discharge Exam: Ceasar Mons Weights   05/06/23 1638  Weight: 36.7 kg   Vitals:   05/08/23 2012 05/09/23 0508  BP: 122/77 107/71  Pulse: 72 (!) 47  Resp: 18 18  Temp: 98.2 F (36.8 C) 97.8 F (36.6 C)  SpO2: 99%  99%   Examination: Physical Exam:  Constitutional: WN/WD, NAD and appears calm and comfortable Eyes: PERRL, lids and conjunctivae normal, sclerae anicteric  ENMT: External Ears, Nose appear normal. Grossly normal hearing. Mucous membranes are moist. Posterior pharynx clear of any exudate or lesions. Normal dentition.  Neck: Appears normal, supple, no cervical masses, normal ROM, no appreciable thyromegaly Respiratory: Clear to auscultation bilaterally, no wheezing, rales, rhonchi or crackles. Normal respiratory effort and patient is not tachypenic. No accessory muscle use.  Cardiovascular: RRR, no murmurs / rubs / gallops. S1 and S2 auscultated. No extremity edema. 2+ pedal pulses. No carotid bruits.  Abdomen: Soft, non-tender, non-distended. No masses palpated. No appreciable hepatosplenomegaly. Bowel sounds positive.  GU: Deferred. Musculoskeletal: No clubbing / cyanosis of digits/nails. No joint deformity upper and lower extremities. Good ROM, no contractures. Normal strength and muscle tone.  Skin: No rashes, lesions, ulcers. No induration; Warm and dry.  Neurologic: CN 2-12 grossly intact with no focal deficits. Sensation intact in all 4 Extremities, DTR normal. Strength 5/5 in all 4. Romberg sign cerebellar reflexes not assessed.  Psychiatric: Normal judgment and insight. Alert and oriented x 3. Normal mood and appropriate affect.   Condition at discharge: stable  The results of significant diagnostics from this hospitalization (including imaging, microbiology, ancillary and laboratory) are listed below for reference.   Imaging Studies: CT HEAD WO CONTRAST ( )  Result Date: 05/06/2023 CLINICAL DATA:  Low hemoglobin and platelets EXAM: CT HEAD WITHOUT CONTRAST TECHNIQUE: Contiguous axial images were obtained from the base of the skull through the vertex without intravenous contrast. RADIATION DOSE REDUCTION: This exam was performed according to the departmental dose-optimization  program which includes automated exposure control, adjustment of the mA and/or kV according to patient size and/or use of iterative reconstruction technique. COMPARISON:  None Available. FINDINGS: Brain: No evidence of acute infarction, hemorrhage, mass, mass effect, or midline shift. No hydrocephalus or extra-axial fluid collection. Vascular: No hyperdense vessel. Skull: Negative for fracture or focal lesion. Sinuses/Orbits: No acute finding. Other: The mastoid air cells are well aerated. IMPRESSION: No acute intracranial process. Electronically Signed   By: Wiliam Ke M.D.   On: 05/06/2023 19:46   DG Chest 2 View  Result Date: 05/06/2023 CLINICAL DATA:  Anemia. EXAM: CHEST - 2 VIEW COMPARISON:  Chest radiograph dated 11/22/2005. FINDINGS: No focal consolidation, pleural effusion, or pneumothorax. The cardiac silhouette is within normal limits. No acute  osseous pathology. IMPRESSION: No active cardiopulmonary disease. Electronically Signed   By: Elgie Collard M.D.   On: 05/06/2023 19:42    Microbiology: Results for orders placed or performed during the hospital encounter of 04/22/21  Urine culture     Status: Abnormal   Collection Time: 04/22/21  9:18 AM   Specimen: Urine, Clean Catch  Result Value Ref Range Status   Specimen Description URINE, CLEAN CATCH  Final   Special Requests NONE  Final   Culture (A)  Final    <10,000 COLONIES/mL INSIGNIFICANT GROWTH Performed at [facility], [address], [place], [place] [address]    Report Status 04/23/2021 FINAL  Final   Labs: CBC: Recent Labs  Lab 05/06/23 1649 05/06/23 1755 05/07/23 1812 05/08/23 0154 05/08/23 1607 05/09/23 0130  WBC 5.0  --  7.3 10.2 10.9* 14.3*  NEUTROABS  --   --  4.5 8.4* 9.4* 11.9*  HGB 3.8*  --  13.8 15.3* 16.1* 14.2  HCT 13.6*  --  41.7 44.9 49.2* 42.9  MCV 64.8*  --  77.9* 79.0* 78.8* 80.9  PLT 17* 18* 23* 26* 55* 70*   Basic Metabolic Panel: Recent Labs  Lab 05/06/23 1649 05/07/23 1812  05/08/23 0154 05/08/23 1607 05/09/23 0130  NA 136 140 136 137 139  K 3.3* 3.6 4.1 3.9 4.2  CL 106 110 106 106 111  CO2 22 23 20* 18* 20*  GLUCOSE 101* 103* 132* 187* 171*  BUN 8 6 6 10 10   CREATININE 0.67 0.65 0.59 0.77 0.60  CALCIUM 9.2 9.3 9.8 9.6 9.3  MG  --  1.7 1.8 2.4 2.1  PHOS  --  3.9 2.9 2.2* 2.4*   Liver Function Tests: Recent Labs  Lab 05/06/23 1649 05/07/23 1812 05/08/23 0154 05/08/23 1607 05/09/23 0130  AST 19 23 26 27 20   ALT 11 10 15 15 12   ALKPHOS 44 43 50 49 47  BILITOT 0.4 0.8 0.7 0.2* 0.5  PROT 6.7 6.8 7.5 7.4 6.6  ALBUMIN 4.0 3.7 4.1 4.0 3.5   CBG: No results for input(s): "GLUCAP" in the last 168 hours.  Discharge time spent: greater than 30 minutes.  Signed: Marguerita Merles, DO [facility] 05/09/2023

## 2023-05-09 NOTE — Progress Notes (Signed)
Reviewed labs. She is responding very well to steroids, so will hold off bone marrow biopsy. Platelet count today at 70K.

## 2023-05-09 NOTE — Progress Notes (Signed)
Discharge instructions (including medications) discussed with and copy provided to patient/caregiver 

## 2023-05-10 ENCOUNTER — Telehealth: Payer: Self-pay | Admitting: Hematology and Oncology

## 2023-05-10 LAB — HEMOGLOBIN A1C
Hgb A1c MFr Bld: 5.9 % — ABNORMAL HIGH (ref 4.8–5.6)
Mean Plasma Glucose: 123 mg/dL

## 2023-05-10 NOTE — Telephone Encounter (Signed)
Spoke with patient confirming upcoming appointment  

## 2023-05-11 ENCOUNTER — Other Ambulatory Visit: Payer: Self-pay | Admitting: Internal Medicine

## 2023-05-11 MED ORDER — LIDOCAINE-PRILOCAINE 2.5-2.5 % EX CREA: 1.0000 | TOPICAL_CREAM | CUTANEOUS | 0 refills | Status: DC | PRN

## 2023-05-14 ENCOUNTER — Other Ambulatory Visit: Payer: Self-pay | Admitting: Internal Medicine

## 2023-05-14 ENCOUNTER — Other Ambulatory Visit: Payer: Self-pay | Admitting: Physician Assistant

## 2023-05-14 ENCOUNTER — Other Ambulatory Visit: Payer: Self-pay

## 2023-05-14 ENCOUNTER — Inpatient Hospital Stay: Payer: No Typology Code available for payment source | Attending: Physician Assistant | Admitting: Physician Assistant

## 2023-05-14 ENCOUNTER — Inpatient Hospital Stay: Payer: No Typology Code available for payment source

## 2023-05-14 VITALS — BP 105/67 | HR 61 | Temp 98.0°F | Resp 16 | Ht 59.0 in | Wt 90.4 lb

## 2023-05-14 DIAGNOSIS — Z7952 Long term (current) use of systemic steroids: Secondary | ICD-10-CM | POA: Insufficient documentation

## 2023-05-14 DIAGNOSIS — Z79899 Other long term (current) drug therapy: Secondary | ICD-10-CM | POA: Insufficient documentation

## 2023-05-14 DIAGNOSIS — D696 Thrombocytopenia, unspecified: Secondary | ICD-10-CM | POA: Diagnosis not present

## 2023-05-14 DIAGNOSIS — D500 Iron deficiency anemia secondary to blood loss (chronic): Secondary | ICD-10-CM | POA: Diagnosis not present

## 2023-05-14 DIAGNOSIS — R519 Headache, unspecified: Secondary | ICD-10-CM | POA: Diagnosis not present

## 2023-05-14 DIAGNOSIS — D509 Iron deficiency anemia, unspecified: Secondary | ICD-10-CM | POA: Insufficient documentation

## 2023-05-14 DIAGNOSIS — D649 Anemia, unspecified: Secondary | ICD-10-CM

## 2023-05-14 DIAGNOSIS — N920 Excessive and frequent menstruation with regular cycle: Secondary | ICD-10-CM | POA: Insufficient documentation

## 2023-05-14 LAB — CMP (CANCER CENTER ONLY)
ALT: 16 U/L (ref 0–44)
AST: 22 U/L (ref 15–41)
Albumin: 3.9 g/dL (ref 3.5–5.0)
Alkaline Phosphatase: 47 U/L (ref 38–126)
Anion gap: 7 (ref 5–15)
BUN: 19 mg/dL (ref 6–20)
CO2: 30 mmol/L (ref 22–32)
Calcium: 10.2 mg/dL (ref 8.9–10.3)
Chloride: 103 mmol/L (ref 98–111)
Creatinine: 0.77 mg/dL (ref 0.44–1.00)
GFR, Estimated: >60 mL/min (ref >60)
Glucose, Bld: 65 mg/dL — ABNORMAL LOW (ref 70–99)
Potassium: 4 mmol/L (ref 3.5–5.1)
Sodium: 140 mmol/L (ref 135–145)
Total Bilirubin: 0.4 mg/dL (ref 0.3–1.2)
Total Protein: 6.3 g/dL — ABNORMAL LOW (ref 6.5–8.1)

## 2023-05-14 LAB — CBC WITH DIFFERENTIAL (CANCER CENTER ONLY)
Abs Immature Granulocytes: 0.03 10*3/uL (ref 0.00–0.07)
Basophils Absolute: 0.1 10*3/uL (ref 0.0–0.1)
Basophils Relative: 1 %
Eosinophils Absolute: 0.1 10*3/uL (ref 0.0–0.5)
Eosinophils Relative: 1 %
HCT: 48.5 % — ABNORMAL HIGH (ref 36.0–46.0)
Hemoglobin: 15.7 g/dL — ABNORMAL HIGH (ref 12.0–15.0)
Immature Granulocytes: 0 %
Lymphocytes Relative: 31 %
Lymphs Abs: 3.1 10*3/uL (ref 0.7–4.0)
MCH: 26.8 pg (ref 26.0–34.0)
MCHC: 32.4 g/dL (ref 30.0–36.0)
MCV: 82.8 fL (ref 80.0–100.0)
Monocytes Absolute: 0.6 10*3/uL (ref 0.1–1.0)
Monocytes Relative: 6 %
Neutro Abs: 6.2 10*3/uL (ref 1.7–7.7)
Neutrophils Relative %: 61 %
Platelet Count: 488 10*3/uL — ABNORMAL HIGH (ref 150–400)
RBC: 5.86 MIL/uL — ABNORMAL HIGH (ref 3.87–5.11)
RDW: 25.5 % — ABNORMAL HIGH (ref 11.5–15.5)
WBC Count: 10 10*3/uL (ref 4.0–10.5)
nRBC: 0 % (ref 0.0–0.2)

## 2023-05-14 LAB — FERRITIN: Ferritin: 49 ng/mL (ref 11–307)

## 2023-05-14 LAB — IRON AND IRON BINDING CAPACITY (CC-WL,HP ONLY)
Iron: 325 ug/dL — ABNORMAL HIGH (ref 28–170)
Saturation Ratios: 77 % — ABNORMAL HIGH (ref 10.4–31.8)
TIBC: 421 ug/dL (ref 250–450)
UIBC: 96 ug/dL — ABNORMAL LOW (ref 148–442)

## 2023-05-14 LAB — SAMPLE TO BLOOD BANK

## 2023-05-14 LAB — IMMATURE PLATELET FRACTION: Immature Platelet Fraction: 8.6 % (ref 1.2–8.6)

## 2023-05-14 NOTE — Progress Notes (Unsigned)
[facility] Telephone:(336) [phone]   Fax:([phone]  PROGRESS NOTE  Patient Care Team: Leilani Able, MD as PCP - General (Family Medicine)  CHIEF COMPLAINTS/PURPOSE OF CONSULTATION:  Iron deficiency anemia Thrombocytopenia  Hematological/Oncological History 05/06/2023-05/09/2023: Admitted for symptomatic anemia and thrombocytopenia. Presented to ED with Hgb of 3.8.Iron studies were done and showed an iron level of 18, UIBC of 49, TIBC 507, saturation ratios of 4%, ferritin level 6, folate level 12.4 and vitamin B12 390.  Received 4 units of PRBCs and received IV ferrlecit 250 mg x 1 dose. Stopped IV iron infusion early due to tingling in her feet. Thrombocytopenia was felt to be secondary to acute anemia. HIV screening was negative. She received dexamethasone 40 PO daily x 4 days.  05/14/2023: Established care with outpatient [facility] Hematology  HISTORY OF PRESENTING ILLNESS:  [name] 22 y.o. female returns for a follow up for iron deficiency anemia secondary to menorrhagia and thrombocytopenia. She was evaluated by Dr. Al Pimple during recent hospitalization where she received 4 units of PRBCs, IV ferrlecit x 1 dose, PO dexamethasone.   On exam today, Ms. Kovar reports that she is heavy significantly better since hospital discharge. Her energy has improved and no longer has a headache. She reports her appetite is stable and denies any dietary restrictions. She reports her menstrual cycle lasts 7 days with 2-3 days of heavy bleeding. She is scheduled for a consultation with OB/GYN to further discuss interventions to improve her heavy menstrual bleeding. She denies fevers, chills, sweats, shortness of breath, chest pain, cough, headaches or dizziness. She has no other complaints. Rest of the ROS is below.   MEDICAL HISTORY:  No past medical history on file.  SURGICAL HISTORY: No past surgical history on file.  SOCIAL HISTORY: Social History   Socioeconomic History    Marital status: Single    Spouse name: Not on file   Number of children: Not on file   Years of education: Not on file   Highest education level: Not on file  Occupational History   Not on file  Tobacco Use   Smoking status: Never   Smokeless tobacco: Never  Vaping Use   Vaping Use: Never used  Substance and Sexual Activity   Alcohol use: Never   Drug use: Never   Sexual activity: Never  Other Topics Concern   Not on file  Social History Narrative   Not on file   Social Determinants of Health   Financial Resource Strain: Not on file  Food Insecurity: Not on file  Transportation Needs: Not on file  Physical Activity: Not on file  Stress: Not on file  Social Connections: Not on file  Intimate Partner Violence: Not on file    FAMILY HISTORY: No family history on file.  ALLERGIES:  has No Known Allergies.  MEDICATIONS:  Current Outpatient Medications  Medication Sig Dispense Refill   acetaminophen (TYLENOL) 325 MG tablet Take 2 tablets (650 mg total) by mouth every 6 (six) hours as needed for headache. 20 tablet 0   cetirizine HCl (ZYRTEC) 1 MG/ML solution Take 10 mLs (10 mg total) by mouth daily. (Patient not taking: Reported on 05/06/2023) 236 mL 2   dexamethasone 20 MG TABS Take 40 mg by mouth daily for 2 days. 4 tablet 0   feeding supplement (ENSURE ENLIVE / ENSURE PLUS) LIQD Take 237 mLs by mouth 2 (two) times daily between meals. 237 mL 12   fluticasone (FLONASE) 50 MCG/ACT nasal spray Place 2 sprays into  both nostrils daily. (Patient not taking: Reported on 05/06/2023) 9.9 mL 2   lidocaine-prilocaine (EMLA) cream Apply 1 Application topically as needed (back pain). 30 g 0   Multiple Vitamin (MULTIVITAMIN WITH MINERALS) TABS tablet Take 1 tablet by mouth daily. 30 tablet 0   pantoprazole (PROTONIX) 40 MG tablet Take 1 tablet (40 mg total) by mouth daily for 2 days. 2 tablet 0   No current facility-administered medications for this visit.    REVIEW OF SYSTEMS:    Constitutional: ( - ) fevers, ( - )  chills , ( - ) night sweats Eyes: ( - ) blurriness of vision, ( - ) double vision, ( - ) watery eyes Ears, nose, mouth, throat, and face: ( - ) mucositis, ( - ) sore throat Respiratory: ( - ) cough, ( - ) dyspnea, ( - ) wheezes Cardiovascular: ( - ) palpitation, ( - ) chest discomfort, ( - ) lower extremity swelling Gastrointestinal:  ( - ) nausea, ( - ) heartburn, ( - ) change in bowel habits Skin: ( - ) abnormal skin rashes Lymphatics: ( - ) new lymphadenopathy, ( - ) easy bruising Neurological: ( - ) numbness, ( - ) tingling, ( - ) new weaknesses Behavioral/Psych: ( - ) mood change, ( - ) new changes  All other systems were reviewed with the patient and are negative.  PHYSICAL EXAMINATION: ECOG PERFORMANCE STATUS: 1 - Symptomatic but completely ambulatory  Vitals:   05/14/23 1416  BP: 105/67  Pulse: 61  Resp: 16  Temp: 98 F (36.7 C)  SpO2: 100%   Filed Weights   05/14/23 1416  Weight: 90 lb 6.4 oz (41 kg)    GENERAL: well appearing female in NAD  SKIN: skin color, texture, turgor are normal, no rashes or significant lesions EYES: conjunctiva are pink and non-injected, sclera clear LUNGS: clear to auscultation and percussion with normal breathing effort HEART: regular rate & rhythm and no murmurs and no lower extremity edema Musculoskeletal: no cyanosis of digits and no clubbing  PSYCH: alert & oriented x 3, fluent speech NEURO: no focal motor/sensory deficits  LABORATORY DATA:  I have reviewed the data as listed    Latest Ref Rng & Units 05/14/2023    1:57 PM 05/09/2023    1:30 AM 05/08/2023    4:07 PM  CBC  WBC 4.0 - 10.5 K/uL 10.0  14.3  10.9   Hemoglobin 12.0 - 15.0 g/dL 16.1  09.6  04.5   Hematocrit 36.0 - 46.0 % 48.5  42.9  49.2   Platelets 150 - 400 K/uL 488  70  55        Latest Ref Rng & Units 05/14/2023    1:57 PM 05/09/2023    1:30 AM 05/08/2023    4:07 PM  CMP  Glucose 70 - 99 mg/dL 65  409  811   BUN 6 - 20  mg/dL 19  10  10    Creatinine 0.44 - 1.00 mg/dL 9.14  7.82  9.56   Sodium 135 - 145 mmol/L 140  139  137   Potassium 3.5 - 5.1 mmol/L 4.0  4.2  3.9   Chloride 98 - 111 mmol/L 103  111  106   CO2 22 - 32 mmol/L 30  20  18    Calcium 8.9 - 10.3 mg/dL 21.3  9.3  9.6   Total Protein 6.5 - 8.1 g/dL 6.3  6.6  7.4   Total Bilirubin 0.3 - 1.2 mg/dL 0.4  0.5  0.2   Alkaline Phos 38 - 126 U/L 47  47  49   AST 15 - 41 U/L 22  20  27    ALT 0 - 44 U/L 16  12  15     RADIOGRAPHIC STUDIES: I have personally reviewed the radiological images as listed and agreed with the findings in the report. CT HEAD WO CONTRAST ( )  Result Date: 05/06/2023 CLINICAL DATA:  Low hemoglobin and platelets EXAM: CT HEAD WITHOUT CONTRAST TECHNIQUE: Contiguous axial images were obtained from the base of the skull through the vertex without intravenous contrast. RADIATION DOSE REDUCTION: This exam was performed according to the departmental dose-optimization program which includes automated exposure control, adjustment of the mA and/or kV according to patient size and/or use of iterative reconstruction technique. COMPARISON:  None Available. FINDINGS: Brain: No evidence of acute infarction, hemorrhage, mass, mass effect, or midline shift. No hydrocephalus or extra-axial fluid collection. Vascular: No hyperdense vessel. Skull: Negative for fracture or focal lesion. Sinuses/Orbits: No acute finding. Other: The mastoid air cells are well aerated. IMPRESSION: No acute intracranial process. Electronically Signed   By: Wiliam Ke M.D.   On: 05/06/2023 19:46   DG Chest 2 View  Result Date: 05/06/2023 CLINICAL DATA:  Anemia. EXAM: CHEST - 2 VIEW COMPARISON:  Chest radiograph dated 11/22/2005. FINDINGS: No focal consolidation, pleural effusion, or pneumothorax. The cardiac silhouette is within normal limits. No acute osseous pathology. IMPRESSION: No active cardiopulmonary disease. Electronically Signed   By: Elgie Collard M.D.   On:  05/06/2023 19:42    ASSESSMENT & PLAN Jacinta Gawrych is a 22 y.o. female who presents for hospital follow up after admission for acute iron deficiency anemia secondary to menorrhagia and thrombocytopenia.   #Iron deficiency anemia 2/2 menorrhagia: --Labs from 05/06/2023 showed severe anemia with Hgb 3.8, MCV 64.8. Iron studies showed an iron level of 18, UIBC of 49, TIBC 507, saturation ratios of 4%, ferritin level 6. --Patient received 4 units of PRBC and IV ferrlecit 250 mg x1 dose.  --Labs today show anemia has resolved with Hgb 15.7, MCV 82.8. Iron panel shows no deficiency with serum iron 325, saturation 77%, TIBC 421, ferritin 49 --No need for additional IV iron or blood transfusion. Advise to continue PO iron once daily.  --Patient plans to undergo OB/GYN consultation to discuss interventions to improve heavy menstrual bleeding --RTC in 4 weeks for labs only and 8 weeks for labs/follow up.   #Thrombocytopenia: --Workup from 05/06/2023 ruled out DIC. Pathology review showed only microcytic hypochromic anemia. Evidence of schistocytes.  --Patient received dexamethasone 40 mg PO daily x 4 . --Platelet count has significantly improved to 488K today --Monitor for now. Strict precautions for bleeding.  --Repeat labs in 4 weeks.   No orders of the defined types were placed in this encounter.   All questions were answered. The patient knows to call the clinic with any problems, questions or concerns.  I have spent a total of 30 minutes minutes of face-to-face and non-face-to-face time, preparing to see the patient, performing a medically appropriate examination, counseling and educating the patient, documenting clinical information in the electronic health record,  and care coordination.   Georga Kaufmann, PA-C Department of Hematology/Oncology [facility] at [facility] Phone: [phone]

## 2023-05-16 MED ORDER — FERROUS SULFATE 325 (65 FE) MG PO TBEC: 325.0000 mg | DELAYED_RELEASE_TABLET | Freq: Every day | ORAL | 3 refills | Status: DC

## 2023-05-17 ENCOUNTER — Telehealth: Payer: Self-pay

## 2023-05-17 NOTE — Telephone Encounter (Signed)
Pt advised with VU and agreed to this plan 

## 2023-05-17 NOTE — Telephone Encounter (Signed)
-----   Message from Briant Cedar, PA-C sent at 05/16/2023  8:28 PM EDT ----- Please notify patient that iron levels are normal. No need for additional IV iron. Recommend to continue with iron pills which I have sent to her pharmacy.    ----- Message ----- From: Interface, Lab In [place] Sent: 05/14/2023   2:03 PM EDT To: Briant Cedar, PA-C

## 2023-06-01 ENCOUNTER — Encounter: Payer: Self-pay | Admitting: Obstetrics and Gynecology

## 2023-06-10 ENCOUNTER — Other Ambulatory Visit: Payer: Self-pay | Admitting: *Deleted

## 2023-06-10 ENCOUNTER — Inpatient Hospital Stay: Payer: No Typology Code available for payment source | Attending: Physician Assistant

## 2023-06-10 ENCOUNTER — Other Ambulatory Visit: Payer: Self-pay

## 2023-06-10 DIAGNOSIS — Z7952 Long term (current) use of systemic steroids: Secondary | ICD-10-CM | POA: Diagnosis not present

## 2023-06-10 DIAGNOSIS — D696 Thrombocytopenia, unspecified: Secondary | ICD-10-CM | POA: Diagnosis not present

## 2023-06-10 DIAGNOSIS — N920 Excessive and frequent menstruation with regular cycle: Secondary | ICD-10-CM | POA: Diagnosis not present

## 2023-06-10 DIAGNOSIS — Z79899 Other long term (current) drug therapy: Secondary | ICD-10-CM | POA: Diagnosis not present

## 2023-06-10 DIAGNOSIS — R202 Paresthesia of skin: Secondary | ICD-10-CM | POA: Diagnosis not present

## 2023-06-10 DIAGNOSIS — D500 Iron deficiency anemia secondary to blood loss (chronic): Secondary | ICD-10-CM | POA: Diagnosis present

## 2023-06-10 LAB — CBC WITH DIFFERENTIAL (CANCER CENTER ONLY)
Abs Immature Granulocytes: 0.01 10*3/uL (ref 0.00–0.07)
Basophils Absolute: 0.1 10*3/uL (ref 0.0–0.1)
Basophils Relative: 1 %
Eosinophils Absolute: 0.1 10*3/uL (ref 0.0–0.5)
Eosinophils Relative: 2 %
HCT: 42.4 % (ref 36.0–46.0)
Hemoglobin: 14.3 g/dL (ref 12.0–15.0)
Immature Granulocytes: 0 %
Lymphocytes Relative: 29 %
Lymphs Abs: 2.1 10*3/uL (ref 0.7–4.0)
MCH: 27.7 pg (ref 26.0–34.0)
MCHC: 33.7 g/dL (ref 30.0–36.0)
MCV: 82.2 fL (ref 80.0–100.0)
Monocytes Absolute: 0.4 10*3/uL (ref 0.1–1.0)
Monocytes Relative: 6 %
Neutro Abs: 4.5 10*3/uL (ref 1.7–7.7)
Neutrophils Relative %: 62 %
Platelet Count: 244 10*3/uL (ref 150–400)
RBC: 5.16 MIL/uL — ABNORMAL HIGH (ref 3.87–5.11)
RDW: 18.4 % — ABNORMAL HIGH (ref 11.5–15.5)
WBC Count: 7.2 10*3/uL (ref 4.0–10.5)
nRBC: 0 % (ref 0.0–0.2)

## 2023-06-10 LAB — CMP (CANCER CENTER ONLY)
ALT: 11 U/L (ref 0–44)
AST: 20 U/L (ref 15–41)
Albumin: 4.4 g/dL (ref 3.5–5.0)
Alkaline Phosphatase: 47 U/L (ref 38–126)
Anion gap: 10 (ref 5–15)
BUN: 13 mg/dL (ref 6–20)
CO2: 23 mmol/L (ref 22–32)
Calcium: 9.7 mg/dL (ref 8.9–10.3)
Chloride: 106 mmol/L (ref 98–111)
Creatinine: 0.6 mg/dL (ref 0.44–1.00)
GFR, Estimated: >60 mL/min (ref >60)
Glucose, Bld: 79 mg/dL (ref 70–99)
Potassium: 3.9 mmol/L (ref 3.5–5.1)
Sodium: 139 mmol/L (ref 135–145)
Total Bilirubin: 0.3 mg/dL (ref 0.3–1.2)
Total Protein: 7.1 g/dL (ref 6.5–8.1)

## 2023-07-07 ENCOUNTER — Ambulatory Visit: Payer: No Typology Code available for payment source | Admitting: Physician Assistant

## 2023-07-07 ENCOUNTER — Other Ambulatory Visit: Payer: No Typology Code available for payment source

## 2023-07-08 ENCOUNTER — Other Ambulatory Visit: Payer: Self-pay | Admitting: Physician Assistant

## 2023-07-08 DIAGNOSIS — D696 Thrombocytopenia, unspecified: Secondary | ICD-10-CM

## 2023-07-08 DIAGNOSIS — D500 Iron deficiency anemia secondary to blood loss (chronic): Secondary | ICD-10-CM

## 2023-07-09 ENCOUNTER — Inpatient Hospital Stay (HOSPITAL_BASED_OUTPATIENT_CLINIC_OR_DEPARTMENT_OTHER): Payer: No Typology Code available for payment source | Admitting: Physician Assistant

## 2023-07-09 ENCOUNTER — Telehealth: Payer: Self-pay

## 2023-07-09 ENCOUNTER — Inpatient Hospital Stay: Payer: No Typology Code available for payment source

## 2023-07-09 VITALS — BP 109/72 | HR 80 | Temp 98.0°F | Resp 16 | Wt 89.0 lb

## 2023-07-09 DIAGNOSIS — D696 Thrombocytopenia, unspecified: Secondary | ICD-10-CM

## 2023-07-09 DIAGNOSIS — D500 Iron deficiency anemia secondary to blood loss (chronic): Secondary | ICD-10-CM

## 2023-07-09 LAB — CBC WITH DIFFERENTIAL (CANCER CENTER ONLY)
Abs Immature Granulocytes: 0.01 10*3/uL (ref 0.00–0.07)
Basophils Absolute: 0 10*3/uL (ref 0.0–0.1)
Basophils Relative: 1 %
Eosinophils Absolute: 0.1 10*3/uL (ref 0.0–0.5)
Eosinophils Relative: 1 %
HCT: 38.6 % (ref 36.0–46.0)
Hemoglobin: 12.9 g/dL (ref 12.0–15.0)
Immature Granulocytes: 0 %
Lymphocytes Relative: 36 %
Lymphs Abs: 1.9 10*3/uL (ref 0.7–4.0)
MCH: 28.1 pg (ref 26.0–34.0)
MCHC: 33.4 g/dL (ref 30.0–36.0)
MCV: 84.1 fL (ref 80.0–100.0)
Monocytes Absolute: 0.3 10*3/uL (ref 0.1–1.0)
Monocytes Relative: 6 %
Neutro Abs: 2.9 10*3/uL (ref 1.7–7.7)
Neutrophils Relative %: 56 %
Platelet Count: 229 10*3/uL (ref 150–400)
RBC: 4.59 MIL/uL (ref 3.87–5.11)
RDW: 13.8 % (ref 11.5–15.5)
WBC Count: 5.3 10*3/uL (ref 4.0–10.5)
nRBC: 0 % (ref 0.0–0.2)

## 2023-07-09 LAB — CMP (CANCER CENTER ONLY)
ALT: 11 U/L (ref 0–44)
AST: 19 U/L (ref 15–41)
Albumin: 4.4 g/dL (ref 3.5–5.0)
Alkaline Phosphatase: 41 U/L (ref 38–126)
Anion gap: 7 (ref 5–15)
BUN: 11 mg/dL (ref 6–20)
CO2: 28 mmol/L (ref 22–32)
Calcium: 10 mg/dL (ref 8.9–10.3)
Chloride: 104 mmol/L (ref 98–111)
Creatinine: 0.71 mg/dL (ref 0.44–1.00)
GFR, Estimated: >60 mL/min (ref >60)
Glucose, Bld: 84 mg/dL (ref 70–99)
Potassium: 3.5 mmol/L (ref 3.5–5.1)
Sodium: 139 mmol/L (ref 135–145)
Total Bilirubin: 0.4 mg/dL (ref 0.3–1.2)
Total Protein: 7.1 g/dL (ref 6.5–8.1)

## 2023-07-09 LAB — IRON AND IRON BINDING CAPACITY (CC-WL,HP ONLY)
Iron: 56 ug/dL (ref 28–170)
Saturation Ratios: 16 % (ref 10.4–31.8)
TIBC: 351 ug/dL (ref 250–450)
UIBC: 295 ug/dL

## 2023-07-09 LAB — FERRITIN: Ferritin: 22 ng/mL (ref 11–307)

## 2023-07-09 NOTE — Telephone Encounter (Signed)
LM for pt with lab results and recommendations.  After insurance approval she will be contacted by [facility] to schedule appts.

## 2023-07-09 NOTE — Telephone Encounter (Signed)
-----   Message from Briant Cedar sent at 07/09/2023  4:06 PM EDT ----- Please notify patient that iron levels have dropped slightly below target (ferritin >30). Recommend IV iron to boost iron levels to prevent anemia.

## 2023-07-09 NOTE — Progress Notes (Signed)
[facility] Telephone:([phone]   Fax:(336) [phone]  PROGRESS NOTE  Patient Care Team: Leilani Able, MD as PCP - General (Family Medicine)  CHIEF COMPLAINTS/PURPOSE OF CONSULTATION:  Iron deficiency anemia Thrombocytopenia  Hematological/Oncological History 05/06/2023-05/09/2023: Admitted for symptomatic anemia and thrombocytopenia. Presented to ED with Hgb of 3.8.Iron studies were done and showed an iron level of 18, UIBC of 49, TIBC 507, saturation ratios of 4%, ferritin level 6, folate level 12.4 and vitamin B12 390.  Received 4 units of PRBCs and received IV ferrlecit 250 mg x 1 dose. Stopped IV iron infusion early due to tingling in her feet. Thrombocytopenia was felt to be secondary to acute anemia. HIV screening was negative. She received dexamethasone 40 PO daily x 4 days.  05/14/2023: Established care with outpatient [facility] Hematology  HISTORY OF PRESENTING ILLNESS:  [name] 22 y.o. female returns for a follow up for iron deficiency anemia secondary to menorrhagia and thrombocytopenia. She was last seen by me on 05/14/2023 and in the interim, she denies any changes to her health.    On exam today, [name] reports she is doing well without any new or worsening symptoms. Her energy levels are back to baseline and she is able to complete her ADLs on her own. She is compliant with taking iron pills once every other day. She has persistent menstrual bleeding that lasts 7 days with 2-3 days of heavy bleeding. She has no other overt signs of bleeding such as hematochezia or melena. She denies fevers, chills, sweats, shortness of breath, chest pain, cough, headaches or dizziness. She has no other complaints. Rest of the ROS is below.   MEDICAL HISTORY:  No past medical history on file.  SURGICAL HISTORY: No past surgical history on file.  SOCIAL HISTORY: Social History   Socioeconomic History   Marital status: Single    Spouse name: Not on file   Number  of children: Not on file   Years of education: Not on file   Highest education level: Not on file  Occupational History   Not on file  Tobacco Use   Smoking status: Never   Smokeless tobacco: Never  Vaping Use   Vaping status: Never Used  Substance and Sexual Activity   Alcohol use: Never   Drug use: Never   Sexual activity: Never  Other Topics Concern   Not on file  Social History Narrative   Not on file   Social Determinants of Health   Financial Resource Strain: Not on file  Food Insecurity: Not on file  Transportation Needs: Not on file  Physical Activity: Not on file  Stress: Not on file  Social Connections: Not on file  Intimate Partner Violence: Not on file    FAMILY HISTORY: No family history on file.  ALLERGIES:  has No Known Allergies.  MEDICATIONS:  Current Outpatient Medications  Medication Sig Dispense Refill   acetaminophen (TYLENOL) 325 MG tablet Take 2 tablets (650 mg total) by mouth every 6 (six) hours as needed for headache. 20 tablet 0   cetirizine HCl (ZYRTEC) 1 MG/ML solution Take 10 mLs (10 mg total) by mouth daily. (Patient not taking: Reported on 05/06/2023) 236 mL 2   dexamethasone 20 MG TABS Take 40 mg by mouth daily for 2 days. 4 tablet 0   feeding supplement (ENSURE ENLIVE / ENSURE PLUS) LIQD Take 237 mLs by mouth 2 (two) times daily between meals. 237 mL 12   ferrous sulfate 325 (65 FE) MG EC tablet  Take 1 tablet (325 mg total) by mouth daily with breakfast. 30 tablet 3   fluticasone (FLONASE) 50 MCG/ACT nasal spray Place 2 sprays into both nostrils daily. (Patient not taking: Reported on 05/06/2023) 9.9 mL 2   lidocaine-prilocaine (EMLA) cream Apply 1 Application topically as needed (back pain). 30 g 0   Multiple Vitamin (MULTIVITAMIN WITH MINERALS) TABS tablet Take 1 tablet by mouth daily. 30 tablet 0   pantoprazole (PROTONIX) 40 MG tablet Take 1 tablet (40 mg total) by mouth daily for 2 days. 2 tablet 0   No current facility-administered  medications for this visit.    REVIEW OF SYSTEMS:   Constitutional: ( - ) fevers, ( - )  chills , ( - ) night sweats Eyes: ( - ) blurriness of vision, ( - ) double vision, ( - ) watery eyes Ears, nose, mouth, throat, and face: ( - ) mucositis, ( - ) sore throat Respiratory: ( - ) cough, ( - ) dyspnea, ( - ) wheezes Cardiovascular: ( - ) palpitation, ( - ) chest discomfort, ( - ) lower extremity swelling Gastrointestinal:  ( - ) nausea, ( - ) heartburn, ( - ) change in bowel habits Skin: ( - ) abnormal skin rashes Lymphatics: ( - ) new lymphadenopathy, ( - ) easy bruising Neurological: ( - ) numbness, ( - ) tingling, ( - ) new weaknesses Behavioral/Psych: ( - ) mood change, ( - ) new changes  All other systems were reviewed with the patient and are negative.  PHYSICAL EXAMINATION: ECOG PERFORMANCE STATUS: 1 - Symptomatic but completely ambulatory  Vitals:   07/09/23 0831  BP: 109/72  Pulse: 80  Resp: 16  Temp: 98 F (36.7 C)  SpO2: 95%   Filed Weights   07/09/23 0831  Weight: 89 lb (40.4 kg)    GENERAL: well appearing female in NAD  SKIN: skin color, texture, turgor are normal, no rashes or significant lesions EYES: conjunctiva are pink and non-injected, sclera clear LUNGS: clear to auscultation and percussion with normal breathing effort HEART: regular rate & rhythm and no murmurs and no lower extremity edema Musculoskeletal: no cyanosis of digits and no clubbing  PSYCH: alert & oriented x 3, fluent speech NEURO: no focal motor/sensory deficits  LABORATORY DATA:  I have reviewed the data as listed    Latest Ref Rng & Units 07/09/2023    7:58 AM 06/10/2023    9:07 AM 05/14/2023    1:57 PM  CBC  WBC 4.0 - 10.5 K/uL 5.3  7.2  10.0   Hemoglobin 12.0 - 15.0 g/dL 24.4  01.0  27.2   Hematocrit 36.0 - 46.0 % 38.6  42.4  48.5   Platelets 150 - 400 K/uL 229  244  488        Latest Ref Rng & Units 07/09/2023    7:58 AM 06/10/2023    9:07 AM 05/14/2023    1:57 PM  CMP  Glucose  70 - 99 mg/dL 84  79  65   BUN 6 - 20 mg/dL 11  13  19    Creatinine 0.44 - 1.00 mg/dL 5.36  6.44  0.34   Sodium 135 - 145 mmol/L 139  139  140   Potassium 3.5 - 5.1 mmol/L 3.5  3.9  4.0   Chloride 98 - 111 mmol/L 104  106  103   CO2 22 - 32 mmol/L 28  23  30    Calcium 8.9 - 10.3 mg/dL 74.2  9.7  59.5  Total Protein 6.5 - 8.1 g/dL 7.1  7.1  6.3   Total Bilirubin 0.3 - 1.2 mg/dL 0.4  0.3  0.4   Alkaline Phos 38 - 126 U/L 41  47  47   AST 15 - 41 U/L 19  20  22    ALT 0 - 44 U/L 11  11  16     RADIOGRAPHIC STUDIES: I have personally reviewed the radiological images as listed and agreed with the findings in the report. No results found.  ASSESSMENT & PLAN Renetha Vero is a 22 y.o. female who presents for hospital follow up after admission for acute iron deficiency anemia secondary to menorrhagia and thrombocytopenia.   #Iron deficiency anemia 2/2 menorrhagia: --Labs from 05/06/2023 showed severe anemia with Hgb 3.8, MCV 64.8. Iron studies showed an iron level of 18, UIBC of 49, TIBC 507, saturation ratios of 4%, ferritin level 6. --Patient received 4 units of PRBC and IV ferrlecit 250 mg x1 dose.  PLAN: --Labs today show no evidence of anemia with Hgb 12.9, MCV 84.1. Iron panel shows no deficiency with serum iron 56, saturation 16%, TIBC 351, ferritin 22 --Recommend IV iron to bolster iron levels to prevent further anemia.  --Advise to continue PO iron once daily.  --Recommend to follow up with OB/GYN to discuss interventions to improve heavy menstrual bleeding --RTC in 3 months for lab check and 6 months for labs/follow up.  #Thrombocytopenia: --Workup from 05/06/2023 ruled out DIC. Pathology review showed only microcytic hypochromic anemia. Evidence of schistocytes.  --Patient received dexamethasone 40 mg PO daily x 4 . --Platelet count resolved and back to normal today at 229K.  --Monitor for now.    No orders of the defined types were placed in this encounter.   All questions  were answered. The patient knows to call the clinic with any problems, questions or concerns.  I have spent a total of 30 minutes minutes of face-to-face and non-face-to-face time, preparing to see the patient, performing a medically appropriate examination, counseling and educating the patient, documenting clinical information in the electronic health record,  and care coordination.   Georga Kaufmann, PA-C Department of Hematology/Oncology [facility] at [facility] Phone: [phone]

## 2023-07-13 NOTE — Telephone Encounter (Signed)
Fax received from [facility] regarding Feraheme orders. Fax directed to Dr. Chilton Greathouse, Pulmonlogy. We have never seen this patient. Routing message to [facility] office for FYI.  [employer] [facility] No call back number or further information provided

## 2023-07-15 NOTE — Telephone Encounter (Signed)
[name] is still in pending status. I will f/u once I have a response from insurance

## 2023-07-21 NOTE — Telephone Encounter (Signed)
Karena Addison,  Patient will need to fail x2 preferred medications.  Currently patient only has failed x1. (Ferrlecit) Preferred medication is Venofer. Would you like to use Venofer?  You also have the right to appeal or conduct a peer to peer. Phone: [phone] Case: [id number]  Selena Batten

## 2023-07-26 ENCOUNTER — Encounter: Payer: Self-pay | Admitting: Pulmonary Disease

## 2023-08-13 ENCOUNTER — Other Ambulatory Visit: Payer: Self-pay | Admitting: Physician Assistant

## 2023-09-09 ENCOUNTER — Ambulatory Visit: Payer: No Typology Code available for payment source | Admitting: *Deleted

## 2023-09-09 VITALS — BP 95/65 | HR 68 | Temp 98.0°F | Resp 20 | Ht 59.0 in | Wt 87.2 lb

## 2023-09-09 DIAGNOSIS — D696 Thrombocytopenia, unspecified: Secondary | ICD-10-CM | POA: Diagnosis not present

## 2023-09-09 DIAGNOSIS — D500 Iron deficiency anemia secondary to blood loss (chronic): Secondary | ICD-10-CM

## 2023-09-09 DIAGNOSIS — N920 Excessive and frequent menstruation with regular cycle: Secondary | ICD-10-CM

## 2023-09-09 MED ORDER — ACETAMINOPHEN 325 MG PO TABS
650.0000 mg | ORAL_TABLET | Freq: Once | ORAL | Status: AC
  Administered 2023-09-09: 650 mg via ORAL
  Filled 2023-09-09: qty 2

## 2023-09-09 MED ORDER — IRON SUCROSE 20 MG/ML IV SOLN
200.0000 mg | Freq: Once | INTRAVENOUS | Status: AC
  Administered 2023-09-09: 200 mg via INTRAVENOUS
  Filled 2023-09-09: qty 10

## 2023-09-09 MED ORDER — DIPHENHYDRAMINE HCL 25 MG PO CAPS
25.0000 mg | ORAL_CAPSULE | Freq: Once | ORAL | Status: AC
  Administered 2023-09-09: 25 mg via ORAL
  Filled 2023-09-09: qty 1

## 2023-09-09 NOTE — Patient Instructions (Signed)
Iron Sucrose Injection What is this medication? IRON SUCROSE (EYE ern SOO krose) treats low levels of iron (iron deficiency anemia) in people with kidney disease. Iron is a mineral that plays an important role in making red blood cells, which carry oxygen from your lungs to the rest of your body. This medicine may be used for other purposes; ask your health care provider or pharmacist if you have questions. COMMON BRAND NAME(S): Venofer What should I tell my care team before I take this medication? They need to know if you have any of these conditions: Anemia not caused by low iron levels Heart disease High levels of iron in the blood Kidney disease Liver disease An unusual or allergic reaction to iron, other medications, foods, dyes, or preservatives Pregnant or trying to get pregnant Breastfeeding How should I use this medication? This medication is for infusion into a vein. It is given in a hospital or clinic setting. Talk to your care team about the use of this medication in children. While this medication may be prescribed for children as young as 2 years for selected conditions, precautions do apply. Overdosage: If you think you have taken too much of this medicine contact a poison control center or emergency room at once. NOTE: This medicine is only for you. Do not share this medicine with others. What if I miss a dose? Keep appointments for follow-up doses. It is important not to miss your dose. Call your care team if you are unable to keep an appointment. What may interact with this medication? Do not take this medication with any of the following: Deferoxamine Dimercaprol Other iron products This medication may also interact with the following: Chloramphenicol Deferasirox This list may not describe all possible interactions. Give your health care provider a list of all the medicines, herbs, non-prescription drugs, or dietary supplements you use. Also tell them if you smoke,  drink alcohol, or use illegal drugs. Some items may interact with your medicine. What should I watch for while using this medication? Visit your care team regularly. Tell your care team if your symptoms do not start to get better or if they get worse. You may need blood work done while you are taking this medication. You may need to follow a special diet. Talk to your care team. Foods that contain iron include: whole grains/cereals, dried fruits, beans, or peas, leafy green vegetables, and organ meats (liver, kidney). What side effects may I notice from receiving this medication? Side effects that you should report to your care team as soon as possible: Allergic reactions--skin rash, itching, hives, swelling of the face, lips, tongue, or throat Low blood pressure--dizziness, feeling faint or lightheaded, blurry vision Shortness of breath Side effects that usually do not require medical attention (report to your care team if they continue or are bothersome): Flushing Headache Joint pain Muscle pain Nausea Pain, redness, or irritation at injection site This list may not describe all possible side effects. Call your doctor for medical advice about side effects. You may report side effects to FDA at 1-800-FDA-1088. Where should I keep my medication? This medication is given in a hospital or clinic. It will not be stored at home. NOTE: This sheet is a summary. It may not cover all possible information. If you have questions about this medicine, talk to your doctor, pharmacist, or health care provider.  2024 Elsevier/Gold Standard (2023-04-02 00:00:00)

## 2023-09-09 NOTE — Progress Notes (Signed)
Diagnosis: Iron Deficiency Anemia  Provider:  Chilton Greathouse MD  Procedure: IV Push  IV Type: Peripheral, IV Location: R Antecubital  Venofer (Iron Sucrose), Dose: 200 mg  Post Infusion IV Care: Observation period completed and Peripheral IV Discontinued  Discharge: Condition: Good, Destination: Home . AVS Provided  Performed by:  Forrest Moron, RN

## 2023-09-23 ENCOUNTER — Ambulatory Visit: Payer: No Typology Code available for payment source

## 2023-09-23 VITALS — BP 91/62 | HR 83 | Temp 98.5°F | Resp 20 | Ht 59.0 in | Wt 87.4 lb

## 2023-09-23 DIAGNOSIS — D500 Iron deficiency anemia secondary to blood loss (chronic): Secondary | ICD-10-CM

## 2023-09-23 DIAGNOSIS — N920 Excessive and frequent menstruation with regular cycle: Secondary | ICD-10-CM | POA: Diagnosis not present

## 2023-09-23 MED ORDER — ACETAMINOPHEN 325 MG PO TABS
650.0000 mg | ORAL_TABLET | Freq: Once | ORAL | Status: AC
  Administered 2023-09-23: 650 mg via ORAL
  Filled 2023-09-23: qty 2

## 2023-09-23 MED ORDER — DIPHENHYDRAMINE HCL 25 MG PO CAPS
25.0000 mg | ORAL_CAPSULE | Freq: Once | ORAL | Status: AC
  Administered 2023-09-23: 25 mg via ORAL
  Filled 2023-09-23: qty 1

## 2023-09-23 MED ORDER — IRON SUCROSE 20 MG/ML IV SOLN
200.0000 mg | Freq: Once | INTRAVENOUS | Status: AC
  Administered 2023-09-23: 200 mg via INTRAVENOUS
  Filled 2023-09-23: qty 10

## 2023-09-23 NOTE — Progress Notes (Signed)
 Diagnosis: Iron Deficiency Anemia  Provider:  Chilton Greathouse MD  Procedure: IV Push  IV Type: Peripheral, IV Location: R Antecubital  Venofer (Iron Sucrose), Dose: 200 mg  Post Infusion IV Care: Observation period completed  Discharge: Condition: Good, Destination: Home . AVS Declined  Performed by:  Adriana Mccallum, RN

## 2023-10-05 ENCOUNTER — Ambulatory Visit: Payer: No Typology Code available for payment source

## 2023-10-06 ENCOUNTER — Inpatient Hospital Stay: Payer: No Typology Code available for payment source | Attending: Hematology and Oncology

## 2023-10-13 ENCOUNTER — Other Ambulatory Visit: Payer: Self-pay

## 2023-10-19 ENCOUNTER — Other Ambulatory Visit: Payer: Self-pay

## 2023-10-19 ENCOUNTER — Telehealth: Payer: Self-pay

## 2023-10-19 ENCOUNTER — Ambulatory Visit: Payer: No Typology Code available for payment source

## 2023-10-19 VITALS — BP 93/64 | HR 72 | Temp 98.5°F | Resp 14 | Ht 59.0 in | Wt 86.4 lb

## 2023-10-19 DIAGNOSIS — N920 Excessive and frequent menstruation with regular cycle: Secondary | ICD-10-CM | POA: Diagnosis not present

## 2023-10-19 DIAGNOSIS — D500 Iron deficiency anemia secondary to blood loss (chronic): Secondary | ICD-10-CM | POA: Diagnosis not present

## 2023-10-19 MED ORDER — DIPHENHYDRAMINE HCL 25 MG PO CAPS
25.0000 mg | ORAL_CAPSULE | Freq: Once | ORAL | Status: AC
  Administered 2023-10-19: 25 mg via ORAL
  Filled 2023-10-19: qty 1

## 2023-10-19 MED ORDER — IRON SUCROSE 20 MG/ML IV SOLN
200.0000 mg | Freq: Once | INTRAVENOUS | Status: AC
  Administered 2023-10-19: 200 mg via INTRAVENOUS
  Filled 2023-10-19: qty 10

## 2023-10-19 MED ORDER — ACETAMINOPHEN 325 MG PO TABS
650.0000 mg | ORAL_TABLET | Freq: Once | ORAL | Status: AC
  Administered 2023-10-19: 650 mg via ORAL
  Filled 2023-10-19: qty 2

## 2023-10-19 NOTE — Progress Notes (Signed)
Diagnosis: Iron Deficiency Anemia  Provider:  Chilton Greathouse MD  Procedure: IV Push  IV Type: Peripheral, IV Location: R Antecubital  Venofer (Iron Sucrose), Dose: 200 mg  Post Infusion IV Care: Observation period completed and Peripheral IV Discontinued  Discharge: Condition: Good, Destination: Home . AVS Declined  Performed by:  Rico Ala, LPN

## 2023-10-19 NOTE — Telephone Encounter (Signed)
Auth Submission: NO AUTH NEEDED Site of care: Site of care: [facility] Payer: [employer] Medication & CPT/J Code(s) submitted: Venofer (Iron Sucrose) J1756 Route of submission (phone, fax, portal):  Phone # Fax # Auth type: Buy/Bill PB Units/visits requested: 200mg  x 1 dose Reference number:  Approval from: 10/19/23 to 11/09/23

## 2023-12-30 ENCOUNTER — Telehealth: Payer: Self-pay | Admitting: Hematology and Oncology

## 2023-12-30 NOTE — Telephone Encounter (Signed)
Rescheduled appointment per room/resource. Talked with the patient and she is aware of the changes made to her upcoming appointments.

## 2024-01-03 ENCOUNTER — Telehealth: Payer: Self-pay

## 2024-01-03 NOTE — Telephone Encounter (Signed)
 Left message on voicemail for patient about appointment on 01/04/24

## 2024-01-04 ENCOUNTER — Inpatient Hospital Stay: Payer: No Typology Code available for payment source | Attending: Hematology and Oncology

## 2024-01-04 ENCOUNTER — Ambulatory Visit: Payer: No Typology Code available for payment source | Admitting: Hematology and Oncology

## 2024-01-04 ENCOUNTER — Other Ambulatory Visit: Payer: No Typology Code available for payment source

## 2024-01-04 ENCOUNTER — Inpatient Hospital Stay (HOSPITAL_BASED_OUTPATIENT_CLINIC_OR_DEPARTMENT_OTHER): Payer: No Typology Code available for payment source | Admitting: Hematology and Oncology

## 2024-01-04 VITALS — BP 94/54 | HR 69 | Temp 98.1°F | Resp 17 | Wt 85.2 lb

## 2024-01-04 DIAGNOSIS — D696 Thrombocytopenia, unspecified: Secondary | ICD-10-CM | POA: Diagnosis not present

## 2024-01-04 DIAGNOSIS — R202 Paresthesia of skin: Secondary | ICD-10-CM | POA: Diagnosis not present

## 2024-01-04 DIAGNOSIS — D500 Iron deficiency anemia secondary to blood loss (chronic): Secondary | ICD-10-CM

## 2024-01-04 DIAGNOSIS — N920 Excessive and frequent menstruation with regular cycle: Secondary | ICD-10-CM | POA: Insufficient documentation

## 2024-01-04 DIAGNOSIS — Z79899 Other long term (current) drug therapy: Secondary | ICD-10-CM | POA: Insufficient documentation

## 2024-01-04 LAB — CBC WITH DIFFERENTIAL (CANCER CENTER ONLY)
Abs Immature Granulocytes: 0 10*3/uL (ref 0.00–0.07)
Basophils Absolute: 0 10*3/uL (ref 0.0–0.1)
Basophils Relative: 1 %
Eosinophils Absolute: 0.1 10*3/uL (ref 0.0–0.5)
Eosinophils Relative: 2 %
HCT: 39 % (ref 36.0–46.0)
Hemoglobin: 13.1 g/dL (ref 12.0–15.0)
Immature Granulocytes: 0 %
Lymphocytes Relative: 39 %
Lymphs Abs: 1.5 10*3/uL (ref 0.7–4.0)
MCH: 26.7 pg (ref 26.0–34.0)
MCHC: 33.6 g/dL (ref 30.0–36.0)
MCV: 79.4 fL — ABNORMAL LOW (ref 80.0–100.0)
Monocytes Absolute: 0.3 10*3/uL (ref 0.1–1.0)
Monocytes Relative: 8 %
Neutro Abs: 2 10*3/uL (ref 1.7–7.7)
Neutrophils Relative %: 50 %
Platelet Count: 251 10*3/uL (ref 150–400)
RBC: 4.91 MIL/uL (ref 3.87–5.11)
RDW: 13 % (ref 11.5–15.5)
WBC Count: 4 10*3/uL (ref 4.0–10.5)
nRBC: 0 % (ref 0.0–0.2)

## 2024-01-04 LAB — FERRITIN: Ferritin: 43 ng/mL (ref 11–307)

## 2024-01-04 LAB — IRON AND IRON BINDING CAPACITY (CC-WL,HP ONLY)
Iron: 70 ug/dL (ref 28–170)
Saturation Ratios: 20 % (ref 10.4–31.8)
TIBC: 357 ug/dL (ref 250–450)
UIBC: 287 ug/dL (ref 148–442)

## 2024-01-04 NOTE — Progress Notes (Signed)
 [facility] [phone]   Fax:([phone]  PROGRESS NOTE  Patient Care Team: Leilani Able, MD as PCP - General (Family Medicine)  CHIEF COMPLAINTS/PURPOSE OF CONSULTATION:  Iron deficiency anemia Thrombocytopenia  Hematological/Oncological History 05/06/2023-05/09/2023: Admitted for symptomatic anemia and thrombocytopenia. Presented to ED with Hgb of 3.8.Iron studies were done and showed an iron level of 18, UIBC of 49, TIBC 507, saturation ratios of 4%, ferritin level 6, folate level 12.4 and vitamin B12 390.  Received 4 units of PRBCs and received IV ferrlecit 250 mg x 1 dose. Stopped IV iron infusion early due to tingling in her feet. Thrombocytopenia was felt to be secondary to acute anemia. HIV screening was negative. She received dexamethasone 40 PO daily x 4 days.  05/14/2023: Established care with outpatient [facility]  HISTORY OF PRESENTING ILLNESS:  Discussed the use of AI scribe software for clinical note transcription with the patient, who gave verbal consent to proceed.  History of Present Illness         [name] is a 23 year old female who presents for a hematology follow-up regarding her menstrual bleeding and iron levels.  Menstrual cycles remain regular and unchanged since the last visit, with no excessive bleeding or new symptoms. She describes her menstrual bleeding as consistent with previous patterns, and there are no other types of bleeding reported.  She is currently taking iron gummies twice daily and has discontinued some previous medications. She occasionally uses Tylenol as needed and has reduced her intake of Ensure. She continues to take multivitamins.  In terms of daily activities, she is able to perform chores and remains active. She reports eating well and sleeping adequately. No leg swelling is noted, and she maintains a petite physique despite a good appetite.  MEDICAL HISTORY:  No past medical history on  file.  SURGICAL HISTORY: No past surgical history on file.  SOCIAL HISTORY: Social History   Socioeconomic History   Marital status: Single    Spouse name: Not on file   Number of children: Not on file   Years of education: Not on file   Highest education level: Not on file  Occupational History   Not on file  Tobacco Use   Smoking status: Never   Smokeless tobacco: Never  Vaping Use   Vaping status: Never Used  Substance and Sexual Activity   Alcohol use: Never   Drug use: Never   Sexual activity: Never  Other Topics Concern   Not on file  Social History Narrative   Not on file   Social Drivers of Health   Financial Resource Strain: Not on file  Food Insecurity: Not on file  Transportation Needs: Not on file  Physical Activity: Not on file  Stress: Not on file  Social Connections: Not on file  Intimate Partner Violence: Not on file    FAMILY HISTORY: No family history on file.  ALLERGIES:  has no known allergies.  MEDICATIONS:  Current Outpatient Medications  Medication Sig Dispense Refill   acetaminophen (TYLENOL) 325 MG tablet Take 2 tablets (650 mg total) by mouth every 6 (six) hours as needed for headache. 20 tablet 0   feeding supplement (ENSURE ENLIVE / ENSURE PLUS) LIQD Take 237 mLs by mouth 2 (two) times daily between meals. 237 mL 12   ferrous sulfate 325 (65 FE) MG EC tablet TAKE 1 TABLET BY MOUTH EVERY DAY WITH BREAKFAST 90 tablet 1   Multiple Vitamin (MULTIVITAMIN WITH MINERALS) TABS tablet Take 1  tablet by mouth daily. 30 tablet 0   No current facility-administered medications for this visit.    REVIEW OF SYSTEMS:   Constitutional: ( - ) fevers, ( - )  chills , ( - ) night sweats Eyes: ( - ) blurriness of vision, ( - ) double vision, ( - ) watery eyes Ears, nose, mouth, throat, and face: ( - ) mucositis, ( - ) sore throat Respiratory: ( - ) cough, ( - ) dyspnea, ( - ) wheezes Cardiovascular: ( - ) palpitation, ( - ) chest discomfort, ( - )  lower extremity swelling Gastrointestinal:  ( - ) nausea, ( - ) heartburn, ( - ) change in bowel habits Skin: ( - ) abnormal skin rashes Lymphatics: ( - ) new lymphadenopathy, ( - ) easy bruising Neurological: ( - ) numbness, ( - ) tingling, ( - ) new weaknesses Behavioral/Psych: ( - ) mood change, ( - ) new changes  All other systems were reviewed with the patient and are negative.  PHYSICAL EXAMINATION: ECOG PERFORMANCE STATUS: 1 - Symptomatic but completely ambulatory  Vitals:   01/04/24 1113  BP: (!) 94/54  Pulse: 69  Resp: 17  Temp: 98.1 F (36.7 C)  SpO2: 99%   Filed Weights   01/04/24 1113  Weight: 85 lb 3.2 oz (38.6 kg)    GENERAL: well appearing female in NAD  SKIN: skin color, texture, turgor are normal, no rashes or significant lesions EYES: conjunctiva are pink and non-injected, sclera clear LUNGS: clear to auscultation and percussion with normal breathing effort HEART: regular rate & rhythm and no murmurs and no lower extremity edema Musculoskeletal: no cyanosis of digits and no clubbing  PSYCH: alert & oriented x 3, fluent speech NEURO: no focal motor/sensory deficits  LABORATORY DATA:  I have reviewed the data as listed    Latest Ref Rng & Units 01/04/2024   10:55 AM 07/09/2023    7:58 AM 06/10/2023    9:07 AM  CBC  WBC 4.0 - 10.5 K/uL 4.0  5.3  7.2   Hemoglobin 12.0 - 15.0 g/dL 16.1  09.6  04.5   Hematocrit 36.0 - 46.0 % 39.0  38.6  42.4   Platelets 150 - 400 K/uL 251  229  244        Latest Ref Rng & Units 07/09/2023    7:58 AM 06/10/2023    9:07 AM 05/14/2023    1:57 PM  CMP  Glucose 70 - 99 mg/dL 84  79  65   BUN 6 - 20 mg/dL 11  13  19    Creatinine 0.44 - 1.00 mg/dL 4.09  8.11  9.14   Sodium 135 - 145 mmol/L 139  139  140   Potassium 3.5 - 5.1 mmol/L 3.5  3.9  4.0   Chloride 98 - 111 mmol/L 104  106  103   CO2 22 - 32 mmol/L 28  23  30    Calcium 8.9 - 10.3 mg/dL 78.2  9.7  95.6   Total Protein 6.5 - 8.1 g/dL 7.1  7.1  6.3   Total Bilirubin 0.3  - 1.2 mg/dL 0.4  0.3  0.4   Alkaline Phos 38 - 126 U/L 41  47  47   AST 15 - 41 U/L 19  20  22    ALT 0 - 44 U/L 11  11  16     RADIOGRAPHIC STUDIES: I have personally reviewed the radiological images as listed and agreed with the findings in the report.  No results found.  ASSESSMENT & PLAN [name] is a 23 y.o. female who presents for hospital follow up after admission for acute iron deficiency anemia secondary to menorrhagia and thrombocytopenia.   #Iron deficiency anemia 2/2 menorrhagia: --Labs from 05/06/2023 showed severe anemia with Hgb 3.8, MCV 64.8. Iron studies showed an iron level of 18, UIBC of 49, TIBC 507, saturation ratios of 4%, ferritin level 6. --Patient received 4 units of PRBC and IV ferrlecit 250 mg x1 dose.  PLAN: --Labs today show no evidence of anemia with Hgb 13.1, normal Iron panel, ferritin pending --If ferritin shows adequate iron stores, plan is to continue oral iron supplement and FU in 6 months with repeat labs.  No thrombocytopenia noted on labs today.   No orders of the defined types were placed in this encounter.   All questions were answered. The patient knows to call the clinic with any problems, questions or concerns.  I have spent a total of 20 minutes minutes of face-to-face and non-face-to-face time, preparing to see the patient, performing a medically appropriate examination, counseling and educating the patient, documenting clinical information in the electronic health record,  and care coordination.

## 2024-06-12 ENCOUNTER — Telehealth: Payer: Self-pay

## 2024-06-12 NOTE — Telephone Encounter (Signed)
 Pt verbally confirmed appt for 8/5

## 2024-06-13 ENCOUNTER — Inpatient Hospital Stay: Payer: No Typology Code available for payment source | Attending: Hematology and Oncology

## 2024-06-13 ENCOUNTER — Inpatient Hospital Stay (HOSPITAL_BASED_OUTPATIENT_CLINIC_OR_DEPARTMENT_OTHER): Payer: No Typology Code available for payment source | Admitting: Hematology and Oncology

## 2024-06-13 VITALS — BP 109/64 | HR 64 | Temp 99.4°F | Resp 16 | Wt 84.8 lb

## 2024-06-13 DIAGNOSIS — Z79899 Other long term (current) drug therapy: Secondary | ICD-10-CM | POA: Insufficient documentation

## 2024-06-13 DIAGNOSIS — D500 Iron deficiency anemia secondary to blood loss (chronic): Secondary | ICD-10-CM | POA: Diagnosis not present

## 2024-06-13 DIAGNOSIS — N920 Excessive and frequent menstruation with regular cycle: Secondary | ICD-10-CM | POA: Diagnosis not present

## 2024-06-13 DIAGNOSIS — R202 Paresthesia of skin: Secondary | ICD-10-CM | POA: Diagnosis not present

## 2024-06-13 DIAGNOSIS — D696 Thrombocytopenia, unspecified: Secondary | ICD-10-CM | POA: Insufficient documentation

## 2024-06-13 LAB — CBC WITH DIFFERENTIAL (CANCER CENTER ONLY)
Abs Immature Granulocytes: 0 K/uL (ref 0.00–0.07)
Basophils Absolute: 0.1 K/uL (ref 0.0–0.1)
Basophils Relative: 1 %
Eosinophils Absolute: 0.1 K/uL (ref 0.0–0.5)
Eosinophils Relative: 2 %
HCT: 39.8 % (ref 36.0–46.0)
Hemoglobin: 13.4 g/dL (ref 12.0–15.0)
Immature Granulocytes: 0 %
Lymphocytes Relative: 48 %
Lymphs Abs: 2 K/uL (ref 0.7–4.0)
MCH: 26.8 pg (ref 26.0–34.0)
MCHC: 33.7 g/dL (ref 30.0–36.0)
MCV: 79.6 fL — ABNORMAL LOW (ref 80.0–100.0)
Monocytes Absolute: 0.3 K/uL (ref 0.1–1.0)
Monocytes Relative: 6 %
Neutro Abs: 1.8 K/uL (ref 1.7–7.7)
Neutrophils Relative %: 43 %
Platelet Count: 287 K/uL (ref 150–400)
RBC: 5 MIL/uL (ref 3.87–5.11)
RDW: 13.6 % (ref 11.5–15.5)
WBC Count: 4.3 K/uL (ref 4.0–10.5)
nRBC: 0 % (ref 0.0–0.2)

## 2024-06-13 LAB — IRON AND IRON BINDING CAPACITY (CC-WL,HP ONLY)
Iron: 49 ug/dL (ref 28–170)
Saturation Ratios: 14 % (ref 10.4–31.8)
TIBC: 357 ug/dL (ref 250–450)
UIBC: 308 ug/dL (ref 148–442)

## 2024-06-13 LAB — FERRITIN: Ferritin: 22 ng/mL (ref 11–307)

## 2024-06-13 NOTE — Progress Notes (Signed)
 [facility] [phone]   [phone]  PROGRESS NOTE  Patient Care Team: Ilah Crigler, MD as PCP - General (Family Medicine)  CHIEF COMPLAINTS/PURPOSE OF CONSULTATION:  Iron  deficiency anemia Thrombocytopenia  Hematological/Oncological History 05/06/2023-05/09/2023: Admitted for symptomatic anemia and thrombocytopenia. Presented to ED with Hgb of 3.8.Iron  studies were done and showed an iron  level of 18, UIBC of 49, TIBC 507, saturation ratios of 4%, ferritin level 6, folate level 12.4 and vitamin B12 390.  Received 4 units of PRBCs and received IV ferrlecit 250 mg x 1 dose. Stopped IV iron  infusion early due to tingling in her feet. Thrombocytopenia was felt to be secondary to acute anemia. HIV screening was negative. She received dexamethasone  40 PO daily x 4 days.  05/14/2023: Established care with outpatient [facility]  HISTORY OF PRESENTING ILLNESS:  Discussed the use of AI scribe software for clinical note transcription with the patient, who gave verbal consent to proceed.  History of Present Illness    Discussed the use of AI scribe software for clinical note transcription with the patient, who gave verbal consent to proceed.  History of Present Illness [name] is a 23 year old female who presents for follow-up regarding her iron  levels and hemoglobin status.  She continues to experience heavy menstruation but denies increased fatigue and states she is feeling 'pretty good'. No weight changes, and she reports sleeping well aside from the menstruation. No bleeding from other sites.  She is currently taking iron  pills twice daily. Her hemoglobin level today is 13.4. She has not experienced any new health issues since her last visit and continues to perform her daily chores without difficulty.  During the review of symptoms, she denies ice cravings and states her nails are the same, neither brittle nor strong. No changes in her overall  health status.    MEDICAL HISTORY:  No past medical history on file.  SURGICAL HISTORY: No past surgical history on file.  SOCIAL HISTORY: Social History   Socioeconomic History   Marital status: Single    Spouse name: Not on file   Number of children: Not on file   Years of education: Not on file   Highest education level: Not on file  Occupational History   Not on file  Tobacco Use   Smoking status: Never   Smokeless tobacco: Never  Vaping Use   Vaping status: Never Used  Substance and Sexual Activity   Alcohol use: Never   Drug use: Never   Sexual activity: Never  Other Topics Concern   Not on file  Social History Narrative   Not on file   Social Drivers of Health   Financial Resource Strain: Not on file  Food Insecurity: Not on file  Transportation Needs: Not on file  Physical Activity: Not on file  Stress: Not on file  Social Connections: Not on file  Intimate Partner Violence: Not on file    FAMILY HISTORY: No family history on file.  ALLERGIES:  has no known allergies.  MEDICATIONS:  Current Outpatient Medications  Medication Sig Dispense Refill   acetaminophen  (TYLENOL ) 325 MG tablet Take 2 tablets (650 mg total) by mouth every 6 (six) hours as needed for headache. 20 tablet 0   feeding supplement (ENSURE ENLIVE / ENSURE PLUS) LIQD Take 237 mLs by mouth 2 (two) times daily between meals. 237 mL 12   ferrous sulfate  325 (65 FE) MG EC tablet TAKE 1 TABLET BY MOUTH EVERY DAY WITH BREAKFAST 90 tablet  1   Multiple Vitamin (MULTIVITAMIN WITH MINERALS) TABS tablet Take 1 tablet by mouth daily. 30 tablet 0   No current facility-administered medications for this visit.    REVIEW OF SYSTEMS:   Constitutional: ( - ) fevers, ( - )  chills , ( - ) night sweats Eyes: ( - ) blurriness of vision, ( - ) double vision, ( - ) watery eyes Ears, nose, mouth, throat, and face: ( - ) mucositis, ( - ) sore throat Respiratory: ( - ) cough, ( - ) dyspnea, ( - )  wheezes Cardiovascular: ( - ) palpitation, ( - ) chest discomfort, ( - ) lower extremity swelling Gastrointestinal:  ( - ) nausea, ( - ) heartburn, ( - ) change in bowel habits Skin: ( - ) abnormal skin rashes Lymphatics: ( - ) new lymphadenopathy, ( - ) easy bruising Neurological: ( - ) numbness, ( - ) tingling, ( - ) new weaknesses Behavioral/Psych: ( - ) mood change, ( - ) new changes  All other systems were reviewed with the patient and are negative.  PHYSICAL EXAMINATION: ECOG PERFORMANCE STATUS: 1 - Symptomatic but completely ambulatory  Vitals:   06/13/24 0944  BP: 109/64  Pulse: 64  Resp: 16  Temp: 99.4 F (37.4 C)  SpO2: 100%    Filed Weights   06/13/24 0944  Weight: 84 lb 12.8 oz (38.5 kg)     GENERAL: well appearing female in NAD , petite. SKIN: skin color, texture, turgor are normal, no rashes or significant lesions EYES: conjunctiva are pink and non-injected, sclera clear LUNGS: clear to auscultation and percussion with normal breathing effort HEART: regular rate & rhythm and no murmurs and no lower extremity edema Musculoskeletal: no cyanosis of digits and no clubbing  PSYCH: alert & oriented x 3, fluent speech NEURO: no focal motor/sensory deficits  LABORATORY DATA:  I have reviewed the data as listed    Latest Ref Rng & Units 06/13/2024    9:25 AM 01/04/2024   10:55 AM 07/09/2023    7:58 AM  CBC  WBC 4.0 - 10.5 K/uL 4.3  4.0  5.3   Hemoglobin 12.0 - 15.0 g/dL 86.5  86.8  87.0   Hematocrit 36.0 - 46.0 % 39.8  39.0  38.6   Platelets 150 - 400 K/uL 287  251  229        Latest Ref Rng & Units 07/09/2023    7:58 AM 06/10/2023    9:07 AM 05/14/2023    1:57 PM  CMP  Glucose 70 - 99 mg/dL 84  79  65   BUN 6 - 20 mg/dL 11  13  19    Creatinine 0.44 - 1.00 mg/dL 9.28  9.39  9.22   Sodium 135 - 145 mmol/L 139  139  140   Potassium 3.5 - 5.1 mmol/L 3.5  3.9  4.0   Chloride 98 - 111 mmol/L 104  106  103   CO2 22 - 32 mmol/L 28  23  30    Calcium 8.9 - 10.3  mg/dL 89.9  9.7  89.7   Total Protein 6.5 - 8.1 g/dL 7.1  7.1  6.3   Total Bilirubin 0.3 - 1.2 mg/dL 0.4  0.3  0.4   Alkaline Phos 38 - 126 U/L 41  47  47   AST 15 - 41 U/L 19  20  22    ALT 0 - 44 U/L 11  11  16     RADIOGRAPHIC STUDIES: I have personally  reviewed the radiological images as listed and agreed with the findings in the report. No results found.  ASSESSMENT & PLAN [name] is a 23 y.o. female who presents for hospital follow up after admission for acute iron  deficiency anemia secondary to menorrhagia and thrombocytopenia.   #Iron  deficiency anemia 2/2 menorrhagia: --Labs from 05/06/2023 showed severe anemia with Hgb 3.8, MCV 64.8. Iron  studies showed an iron  level of 18, UIBC of 49, TIBC 507, saturation ratios of 4%, ferritin level 6. --Patient received 4 units of PRBC and IV ferrlecit 250 mg x1 dose.  PLAN: --Labs today show no evidence of anemia, iron  panel and ferritin pending --If ferritin shows adequate iron  stores, plan is to continue oral iron  supplement and FU in 1 yr with labs.   No orders of the defined types were placed in this encounter.   All questions were answered. The patient knows to call the clinic with any problems, questions or concerns.  I have spent a total of 20 minutes minutes of face-to-face and non-face-to-face time, preparing to see the patient, performing a medically appropriate examination, counseling and educating the patient, documenting clinical information in the electronic health record,  and care coordination.

## 2024-06-21 ENCOUNTER — Ambulatory Visit: Payer: Self-pay | Admitting: Hematology and Oncology

## 2024-06-22 NOTE — Telephone Encounter (Signed)
-----   Message from Franklin Iruku sent at 06/21/2024  3:05 PM EDT ----- Labs look well, continue oral iron supplementation. ----- Message ----- From: Neomi Johnston ONEIDA DEVONNA Sent: 06/14/2024  11:31 PM EDT To: Amber Stalls, MD   ----- Message ----- From: Rebecka, Lab In [place] Sent: 06/13/2024   9:43 AM EDT To: Johnston ONEIDA Neomi, PA-C

## 2024-06-22 NOTE — Telephone Encounter (Signed)
 This RN spoke with pt per MD review and continue oral iron  No further questions.

## 2024-10-25 ENCOUNTER — Telehealth: Payer: Self-pay | Admitting: Hematology and Oncology

## 2024-10-25 NOTE — Telephone Encounter (Signed)
 Left pt a voicemail regarding 06/11/25 appt being rescheduled to 06/12/25.

## 2025-06-11 ENCOUNTER — Other Ambulatory Visit

## 2025-06-11 ENCOUNTER — Ambulatory Visit: Admitting: Hematology and Oncology

## 2025-06-12 ENCOUNTER — Inpatient Hospital Stay: Admitting: Hematology and Oncology

## 2025-06-12 ENCOUNTER — Inpatient Hospital Stay
